# Patient Record
Sex: Female | Born: 1955 | Race: Black or African American | Hispanic: No | Marital: Single | Smoking: Never smoker
Health system: Southern US, Community
[De-identification: ages and names within clinical notes are randomized; demographics above are authoritative.]

## PROBLEM LIST (undated history)

## (undated) DIAGNOSIS — E785 Hyperlipidemia, unspecified: Secondary | ICD-10-CM

## (undated) DIAGNOSIS — G458 Other transient cerebral ischemic attacks and related syndromes: Secondary | ICD-10-CM

## (undated) DIAGNOSIS — I10 Essential (primary) hypertension: Secondary | ICD-10-CM

## (undated) DIAGNOSIS — F410 Panic disorder [episodic paroxysmal anxiety] without agoraphobia: Secondary | ICD-10-CM

## (undated) DIAGNOSIS — J45909 Unspecified asthma, uncomplicated: Secondary | ICD-10-CM

## (undated) DIAGNOSIS — K219 Gastro-esophageal reflux disease without esophagitis: Secondary | ICD-10-CM

## (undated) DIAGNOSIS — T7840XA Allergy, unspecified, initial encounter: Secondary | ICD-10-CM

## (undated) HISTORY — DX: Unspecified asthma, uncomplicated: J45.909

## (undated) HISTORY — DX: Allergy, unspecified, initial encounter: T78.40XA

## (undated) HISTORY — DX: Gastro-esophageal reflux disease without esophagitis: K21.9

## (undated) HISTORY — DX: Other transient cerebral ischemic attacks and related syndromes: G45.8

## (undated) HISTORY — PX: ABDOMINAL HYSTERECTOMY: SHX81

## (undated) HISTORY — DX: Hyperlipidemia, unspecified: E78.5

---

## 2009-08-04 ENCOUNTER — Ambulatory Visit (HOSPITAL_COMMUNITY): Admission: RE | Admit: 2009-08-04 | Discharge: 2009-08-04 | Payer: Self-pay | Admitting: Family Medicine

## 2010-08-26 ENCOUNTER — Other Ambulatory Visit (HOSPITAL_COMMUNITY): Payer: Self-pay | Admitting: Family Medicine

## 2010-08-30 ENCOUNTER — Other Ambulatory Visit (HOSPITAL_COMMUNITY): Payer: Self-pay | Admitting: Family Medicine

## 2010-08-30 DIAGNOSIS — Z1231 Encounter for screening mammogram for malignant neoplasm of breast: Secondary | ICD-10-CM

## 2010-09-09 ENCOUNTER — Ambulatory Visit (HOSPITAL_COMMUNITY)
Admission: RE | Admit: 2010-09-09 | Discharge: 2010-09-09 | Disposition: A | Discharge status: Home / Self Care | Payer: Self-pay | Source: Ambulatory Visit | Attending: Diagnostic Radiology | Admitting: Diagnostic Radiology

## 2010-09-09 DIAGNOSIS — Z1231 Encounter for screening mammogram for malignant neoplasm of breast: Secondary | ICD-10-CM

## 2011-08-19 ENCOUNTER — Other Ambulatory Visit (HOSPITAL_COMMUNITY): Payer: Self-pay | Admitting: Family Medicine

## 2014-02-21 ENCOUNTER — Emergency Department (HOSPITAL_COMMUNITY): Payer: Self-pay

## 2014-02-21 ENCOUNTER — Encounter (HOSPITAL_COMMUNITY): Payer: Self-pay | Admitting: *Deleted

## 2014-02-21 ENCOUNTER — Emergency Department (HOSPITAL_COMMUNITY)
Admission: EM | Admit: 2014-02-21 | Discharge: 2014-02-21 | Disposition: A | Discharge status: Home / Self Care | Payer: Self-pay | Attending: Emergency Medicine | Admitting: Emergency Medicine

## 2014-02-21 ENCOUNTER — Encounter (HOSPITAL_COMMUNITY): Payer: Self-pay | Admitting: Emergency Medicine

## 2014-02-21 DIAGNOSIS — R0602 Shortness of breath: Secondary | ICD-10-CM | POA: Insufficient documentation

## 2014-02-21 DIAGNOSIS — F410 Panic disorder [episodic paroxysmal anxiety] without agoraphobia: Secondary | ICD-10-CM | POA: Insufficient documentation

## 2014-02-21 DIAGNOSIS — N390 Urinary tract infection, site not specified: Secondary | ICD-10-CM | POA: Insufficient documentation

## 2014-02-21 DIAGNOSIS — R002 Palpitations: Secondary | ICD-10-CM | POA: Insufficient documentation

## 2014-02-21 DIAGNOSIS — I10 Essential (primary) hypertension: Secondary | ICD-10-CM | POA: Insufficient documentation

## 2014-02-21 DIAGNOSIS — Z79899 Other long term (current) drug therapy: Secondary | ICD-10-CM | POA: Insufficient documentation

## 2014-02-21 DIAGNOSIS — E876 Hypokalemia: Secondary | ICD-10-CM | POA: Insufficient documentation

## 2014-02-21 DIAGNOSIS — Z792 Long term (current) use of antibiotics: Secondary | ICD-10-CM | POA: Insufficient documentation

## 2014-02-21 DIAGNOSIS — R000 Tachycardia, unspecified: Secondary | ICD-10-CM | POA: Insufficient documentation

## 2014-02-21 DIAGNOSIS — R200 Anesthesia of skin: Secondary | ICD-10-CM | POA: Insufficient documentation

## 2014-02-21 DIAGNOSIS — R531 Weakness: Secondary | ICD-10-CM | POA: Insufficient documentation

## 2014-02-21 DIAGNOSIS — Z880 Allergy status to penicillin: Secondary | ICD-10-CM | POA: Insufficient documentation

## 2014-02-21 DIAGNOSIS — R51 Headache: Secondary | ICD-10-CM | POA: Insufficient documentation

## 2014-02-21 DIAGNOSIS — R55 Syncope and collapse: Secondary | ICD-10-CM | POA: Insufficient documentation

## 2014-02-21 HISTORY — DX: Essential (primary) hypertension: I10

## 2014-02-21 HISTORY — DX: Panic disorder (episodic paroxysmal anxiety): F41.0

## 2014-02-21 LAB — COMPREHENSIVE METABOLIC PANEL WITH GFR
ALT: 35 U/L (ref 0–35)
AST: 37 U/L (ref 0–37)
Albumin: 4.5 g/dL (ref 3.5–5.2)
Alkaline Phosphatase: 110 U/L (ref 39–117)
Anion gap: 7 (ref 5–15)
BUN: 17 mg/dL (ref 6–23)
CO2: 25 mmol/L (ref 19–32)
Calcium: 9.4 mg/dL (ref 8.4–10.5)
Chloride: 106 meq/L (ref 96–112)
Creatinine, Ser: 0.86 mg/dL (ref 0.50–1.10)
GFR calc Af Amer: 85 mL/min — ABNORMAL LOW
GFR calc non Af Amer: 74 mL/min — ABNORMAL LOW
Glucose, Bld: 234 mg/dL — ABNORMAL HIGH (ref 70–99)
Potassium: 2.8 mmol/L — ABNORMAL LOW (ref 3.5–5.1)
Sodium: 138 mmol/L (ref 135–145)
Total Bilirubin: 0.4 mg/dL (ref 0.3–1.2)
Total Protein: 8 g/dL (ref 6.0–8.3)

## 2014-02-21 LAB — LIPASE, BLOOD: Lipase: 53 U/L (ref 11–59)

## 2014-02-21 LAB — URINALYSIS, ROUTINE W REFLEX MICROSCOPIC
Bilirubin Urine: NEGATIVE
Glucose, UA: 250 mg/dL — AB
Ketones, ur: NEGATIVE mg/dL
Nitrite: NEGATIVE
Protein, ur: NEGATIVE mg/dL
Specific Gravity, Urine: 1.009 (ref 1.005–1.030)
Urobilinogen, UA: 0.2 mg/dL (ref 0.0–1.0)
pH: 5.5 (ref 5.0–8.0)

## 2014-02-21 LAB — CBC WITH DIFFERENTIAL/PLATELET
Basophils Absolute: 0 10*3/uL (ref 0.0–0.1)
Basophils Relative: 0 % (ref 0–1)
Eosinophils Absolute: 0.1 10*3/uL (ref 0.0–0.7)
Eosinophils Relative: 1 % (ref 0–5)
HCT: 39.9 % (ref 36.0–46.0)
Hemoglobin: 13.1 g/dL (ref 12.0–15.0)
Lymphocytes Relative: 19 % (ref 12–46)
Lymphs Abs: 1.9 10*3/uL (ref 0.7–4.0)
MCH: 30.2 pg (ref 26.0–34.0)
MCHC: 32.8 g/dL (ref 30.0–36.0)
MCV: 91.9 fL (ref 78.0–100.0)
Monocytes Absolute: 1 10*3/uL (ref 0.1–1.0)
Monocytes Relative: 9 % (ref 3–12)
Neutro Abs: 7.5 10*3/uL (ref 1.7–7.7)
Neutrophils Relative %: 71 % (ref 43–77)
Platelets: 277 10*3/uL (ref 150–400)
RBC: 4.34 MIL/uL (ref 3.87–5.11)
RDW: 12.5 % (ref 11.5–15.5)
WBC: 10.4 10*3/uL (ref 4.0–10.5)

## 2014-02-21 LAB — I-STAT TROPONIN, ED: Troponin i, poc: 0 ng/mL (ref 0.00–0.08)

## 2014-02-21 LAB — URINE MICROSCOPIC-ADD ON

## 2014-02-21 LAB — D-DIMER, QUANTITATIVE: D-Dimer, Quant: 0.32 ug{FEU}/mL (ref 0.00–0.48)

## 2014-02-21 MED ORDER — CIPROFLOXACIN HCL 500 MG PO TABS: 500.0000 mg | ORAL_TABLET | Freq: Two times a day (BID) | ORAL | Status: DC

## 2014-02-21 MED ORDER — ALPRAZOLAM 0.25 MG PO TABS: 0.2500 mg | ORAL_TABLET | Freq: Three times a day (TID) | ORAL | Status: DC | PRN

## 2014-02-21 MED ORDER — SODIUM CHLORIDE 0.9 % IV SOLN
INTRAVENOUS | Status: DC
  Administered 2014-02-21: 01:00:00 via INTRAVENOUS

## 2014-02-21 MED ORDER — ALPRAZOLAM 0.25 MG PO TABS
0.2500 mg | ORAL_TABLET | Freq: Once | ORAL | Status: AC
  Administered 2014-02-21: 0.25 mg via ORAL
  Filled 2014-02-21: qty 1

## 2014-02-21 MED ORDER — CIPROFLOXACIN HCL 500 MG PO TABS
500.0000 mg | ORAL_TABLET | Freq: Once | ORAL | Status: AC
  Administered 2014-02-21: 500 mg via ORAL
  Filled 2014-02-21: qty 1

## 2014-02-21 MED ORDER — POTASSIUM CHLORIDE ER 10 MEQ PO TBCR: 10.0000 meq | EXTENDED_RELEASE_TABLET | Freq: Two times a day (BID) | ORAL | Status: DC

## 2014-02-21 MED ORDER — POTASSIUM CHLORIDE CRYS ER 20 MEQ PO TBCR
40.0000 meq | EXTENDED_RELEASE_TABLET | Freq: Once | ORAL | Status: AC
  Administered 2014-02-21: 40 meq via ORAL
  Filled 2014-02-21: qty 2

## 2014-02-21 NOTE — ED Notes (Signed)
Patient transported to CT 

## 2014-02-21 NOTE — Discharge Instructions (Signed)

## 2014-02-21 NOTE — ED Notes (Signed)
Returned from xray

## 2014-02-21 NOTE — Discharge Instructions (Signed)
Workup for symptoms without any significant findings. Only exception would be possible urinary tract infection. Urine culture was sent and is pending and will confirm. Take the Cipro as directed. Follow-up with your doctor in the next few days. Return for any new or worse symptoms. In addition lab findings showed some mild the low potassium level. Take the potassium as directed.

## 2014-02-21 NOTE — ED Provider Notes (Signed)
CSN: [id number]     Arrival date & time 02/21/14  2120 History  This chart was scribed for Elpidio Anis, PA-C with Suzi Roots, MD by Tonye Royalty, ED Scribe. This patient was seen in room WTR6/WTR6 and the patient's care was started at 9:55 PM.    Chief Complaint  Patient presents with  . Panic Attack   The history is provided by the patient. No language interpreter was used.    HPI Comments: Joyce Lynn is a 59 y.o. female who presents to the Emergency Department complaining of panic attacks. She was evaluated this morning at 0300 for the same, but states it is worse now. She reports history of panic attacks for which she normally takes Xanax. She states her Xanax is almost out and has been "pinching off" and using small amounts. She states she has been having chest pressure, palpitations, chills in her back, tingling in her legs and hands, SOB, and posterior headache. She reports large amount of recent weight gain. She had a thorough workup this morning. She states she has not had thyroid testing in some time.  Past Medical History  Diagnosis Date  . Panic attacks   . Hypertension    Past Surgical History  Procedure Laterality Date  . Abdominal hysterectomy     History reviewed. No pertinent family history. History  Substance Use Topics  . Smoking status: Never Smoker   . Smokeless tobacco: Not on file  . Alcohol Use: Yes     Comment: occ   OB History    No data available     Review of Systems  Constitutional: Positive for chills and unexpected weight change.  Respiratory: Positive for shortness of breath.   Cardiovascular: Positive for chest pain and palpitations.  Neurological: Positive for headaches.       Tingling  Psychiatric/Behavioral: The patient is nervous/anxious.      Allergies  Penicillins  Home Medications   Prior to Admission medications   Medication Sig Start Date End Date Taking? Authorizing Provider  ALPRAZolam Prudy Feeler) 0.25 MG tablet Take  0.25 mg by mouth daily as needed for anxiety.   Yes Historical Provider, MD  ciprofloxacin (CIPRO) 500 MG tablet Take 1 tablet (500 mg total) by mouth 2 (two) times daily. 02/21/14  Yes Vanetta Mulders, MD  escitalopram (LEXAPRO) 10 MG tablet Take 10 mg by mouth daily.   Yes Historical Provider, MD  lisinopril-hydrochlorothiazide (PRINZIDE,ZESTORETIC) 20-25 MG per tablet Take 1 tablet by mouth daily.   Yes Historical Provider, MD  Melatonin 10 MG TABS Take 1 tablet by mouth at bedtime as needed (sleep).   Yes Historical Provider, MD  potassium chloride (K-DUR) 10 MEQ tablet Take 1 tablet (10 mEq total) by mouth 2 (two) times daily. 02/21/14  Yes Vanetta Mulders, MD   BP 134/81 mmHg  Pulse 130  Temp(Src) 98.1 F (36.7 C) (Oral)  Resp 16  SpO2 100% Physical Exam  Constitutional: She is oriented to person, place, and time. She appears well-developed and well-nourished.  HENT:  Head: Normocephalic and atraumatic.  Eyes: Conjunctivae are normal.  Neck: Normal range of motion. Neck supple.  Cardiovascular: Regular rhythm and normal heart sounds.   No murmur heard. tachycardic  Pulmonary/Chest: Effort normal and breath sounds normal. No respiratory distress. She has no wheezes. She has no rales.  Musculoskeletal: Normal range of motion.  Neurological: She is alert and oriented to person, place, and time.  Skin: Skin is warm and dry.  Psychiatric: Her mood appears  anxious.  Nursing note and vitals reviewed.   ED Course  Procedures (including critical care time)  DIAGNOSTIC STUDIES: Oxygen Saturation is 100% on room air, normal by my interpretation.    COORDINATION OF CARE: 9:59 PM Discussed treatment plan with patient at beside, including prescription for Xanax with follow up to her PCP. Discussed that since she had a very thorough workup this morning and she states this is like previous panic attacks, I do not plan to order any more. The patient agrees with the plan and has no further  questions at this time.   Labs Review Labs Reviewed - No data to display  Imaging Review Dg Chest 2 View  02/21/2014   CLINICAL DATA:  Shortness of breath, near syncope.  EXAM: CHEST  2 VIEW  COMPARISON:  None.  FINDINGS: Cardiomediastinal silhouette is unremarkable. The lungs are clear without pleural effusions or focal consolidations. Trachea projects midline and there is no pneumothorax. Soft tissue planes and included osseous structures are non-suspicious.  IMPRESSION: Normal chest.   Electronically Signed   By: Awilda Metro   On: 02/21/2014 02:28   Ct Head Wo Contrast  02/21/2014   CLINICAL DATA:  Near syncope and shortness of breath  EXAM: CT HEAD WITHOUT CONTRAST  TECHNIQUE: Contiguous axial images were obtained from the base of the skull through the vertex without intravenous contrast.  COMPARISON:  None.  FINDINGS: Skull and Sinuses:Negative for fracture or destructive process. The mastoids, middle ears, and imaged paranasal sinuses are clear.  Orbits: Bilateral cataract resection.  Brain: No evidence of acute infarction, hemorrhage, hydrocephalus, or mass lesion/mass effect.  IMPRESSION: Negative head CT.   Electronically Signed   By: Tiburcio Pea M.D.   On: 02/21/2014 02:09     EKG Interpretation None      MDM   Final diagnoses:  None    1. Panic and anxiety  The patient returns to the emergency room tonight after being evaluated for similar symptoms this morning - palpitations, anxiety, SOB). She states symptoms are like her panic attacks and she is out of her Xanax. Chart reviewed. Full work up this morning, including Head CT, labs (incl. Neg D-Dimer). Do not feel any further evaluation is required. She will see her doctor this week for further management of panic and anxiety.  I personally performed the services described in this documentation, which was scribed in my presence. The recorded information has been reviewed and is accurate.     Arnoldo Hooker,  PA-C 02/21/14 2222  Suzi Roots, MD 02/22/14 [phone]

## 2014-02-21 NOTE — ED Provider Notes (Signed)
CSN: [id number]     Arrival date & time 02/21/14  0036 History   First MD Initiated Contact with Patient 02/21/14 0048     Chief Complaint  Patient presents with  . Palpitations  . Numbness     (Consider location/radiation/quality/duration/timing/severity/associated sxs/prior Treatment) Patient is a 59 y.o. female presenting with palpitations. The history is provided by the patient.  Palpitations Associated symptoms: dizziness and shortness of breath   Associated symptoms: no chest pain, no nausea and no vomiting    patient presents with a complaint of numbness to the back of her head and dizziness feeling without vertigo for a couple weeks. A light feeling in the chest no real chest pain. Some shortness of breath. Bilateral numbness to the legs that began today. Patient also states that she been having urinary frequency. But no pain with urination. No loss of consciousness no syncope no chest pain. No fevers.  Past Medical History  Diagnosis Date  . Panic attacks   . Hypertension    Past Surgical History  Procedure Laterality Date  . Abdominal hysterectomy     No family history on file. History  Substance Use Topics  . Smoking status: Never Smoker   . Smokeless tobacco: Not on file  . Alcohol Use: Yes     Comment: occ   OB History    No data available     Review of Systems  Constitutional: Negative for fever.  HENT: Negative for congestion.   Eyes: Negative for visual disturbance.  Respiratory: Positive for shortness of breath.   Cardiovascular: Positive for palpitations. Negative for chest pain.  Gastrointestinal: Negative for nausea, vomiting and abdominal pain.  Genitourinary: Positive for frequency. Negative for dysuria.  Musculoskeletal: Negative for myalgias.  Skin: Negative for rash.  Neurological: Positive for dizziness and weakness.  Hematological: Does not bruise/bleed easily.  Psychiatric/Behavioral: Negative for confusion.      Allergies   Penicillins  Home Medications   Prior to Admission medications   Medication Sig Start Date End Date Taking? Authorizing Provider  ALPRAZolam Prudy Feeler) 0.25 MG tablet Take 0.25 mg by mouth daily as needed for anxiety.   Yes Historical Provider, MD  escitalopram (LEXAPRO) 10 MG tablet Take 10 mg by mouth daily.   Yes Historical Provider, MD  lisinopril-hydrochlorothiazide (PRINZIDE,ZESTORETIC) 20-25 MG per tablet Take 1 tablet by mouth daily.   Yes Historical Provider, MD  Melatonin 10 MG TABS Take 1 tablet by mouth at bedtime as needed (sleep).   Yes Historical Provider, MD  ciprofloxacin (CIPRO) 500 MG tablet Take 1 tablet (500 mg total) by mouth 2 (two) times daily. 02/21/14   Vanetta Mulders, MD  potassium chloride (K-DUR) 10 MEQ tablet Take 1 tablet (10 mEq total) by mouth 2 (two) times daily. 02/21/14   Vanetta Mulders, MD   BP 129/87 mmHg  Pulse 109  Temp(Src) 98.2 F (36.8 C) (Oral)  Resp 17  Ht  (1.626 m)  Wt 164 lb (74.39 kg)  BMI 28.14 kg/m2  SpO2 100% Physical Exam  Constitutional: She is oriented to person, place, and time. She appears well-developed and well-nourished. No distress.  HENT:  Head: Normocephalic and atraumatic.  Mouth/Throat: Oropharynx is clear and moist.  Eyes: Conjunctivae and EOM are normal. Pupils are equal, round, and reactive to light.  Neck: Normal range of motion.  Cardiovascular: Normal rate, regular rhythm and normal heart sounds.   Pulmonary/Chest: Effort normal and breath sounds normal. No respiratory distress.  Abdominal: Soft. Bowel sounds are normal. There  is no tenderness.  Musculoskeletal: Normal range of motion. She exhibits no edema.  Neurological: She is alert and oriented to person, place, and time. No cranial nerve deficit. She exhibits normal muscle tone. Coordination normal.  Skin: Skin is warm. No rash noted.  Nursing note and vitals reviewed.   ED Course  Procedures (including critical care time) Labs Review Labs Reviewed   URINALYSIS, ROUTINE W REFLEX MICROSCOPIC - Abnormal; Notable for the following:    Glucose, UA 250 (*)    Hgb urine dipstick TRACE (*)    Leukocytes, UA SMALL (*)    All other components within normal limits  COMPREHENSIVE METABOLIC PANEL - Abnormal; Notable for the following:    Potassium 2.8 (*)    Glucose, Bld 234 (*)    GFR calc non Af Amer 74 (*)    GFR calc Af Amer 85 (*)    All other components within normal limits  URINE CULTURE  LIPASE, BLOOD  CBC WITH DIFFERENTIAL  D-DIMER, QUANTITATIVE  URINE MICROSCOPIC-ADD ON  I-STAT TROPOININ, ED   Results for orders placed or performed during the hospital encounter of 02/21/14  Lipase, blood  Result Value Ref Range   Lipase 53 11 - 59 U/L  Urinalysis, Routine w reflex microscopic  Result Value Ref Range   Color, Urine YELLOW YELLOW   APPearance CLEAR CLEAR   Specific Gravity, Urine 1.009 1.005 - 1.030   pH 5.5 5.0 - 8.0   Glucose, UA 250 (A) NEGATIVE mg/dL   Hgb urine dipstick TRACE (A) NEGATIVE   Bilirubin Urine NEGATIVE NEGATIVE   Ketones, ur NEGATIVE NEGATIVE mg/dL   Protein, ur NEGATIVE NEGATIVE mg/dL   Urobilinogen, UA 0.2 0.0 - 1.0 mg/dL   Nitrite NEGATIVE NEGATIVE   Leukocytes, UA SMALL (A) NEGATIVE  CBC with Differential  Result Value Ref Range   WBC 10.4 4.0 - 10.5 K/uL   RBC 4.34 3.87 - 5.11 MIL/uL   Hemoglobin 13.1 12.0 - 15.0 g/dL   HCT 69.6 29.5 - 28.4 %   MCV 91.9 78.0 - 100.0 fL   MCH 30.2 26.0 - 34.0 pg   MCHC 32.8 30.0 - 36.0 g/dL   RDW 13.2 44.0 - 10.2 %   Platelets 277 150 - 400 K/uL   Neutrophils Relative % 71 43 - 77 %   Neutro Abs 7.5 1.7 - 7.7 K/uL   Lymphocytes Relative 19 12 - 46 %   Lymphs Abs 1.9 0.7 - 4.0 K/uL   Monocytes Relative 9 3 - 12 %   Monocytes Absolute 1.0 0.1 - 1.0 K/uL   Eosinophils Relative 1 0 - 5 %   Eosinophils Absolute 0.1 0.0 - 0.7 K/uL   Basophils Relative 0 0 - 1 %   Basophils Absolute 0.0 0.0 - 0.1 K/uL  Comprehensive metabolic panel  Result Value Ref Range    Sodium 138 135 - 145 mmol/L   Potassium 2.8 (L) 3.5 - 5.1 mmol/L   Chloride 106 96 - 112 mEq/L   CO2 25 19 - 32 mmol/L   Glucose, Bld 234 (H) 70 - 99 mg/dL   BUN 17 6 - 23 mg/dL   Creatinine, Ser 7.25 0.50 - 1.10 mg/dL   Calcium 9.4 8.4 - 36.6 mg/dL   Total Protein 8.0 6.0 - 8.3 g/dL   Albumin 4.5 3.5 - 5.2 g/dL   AST 37 0 - 37 U/L   ALT 35 0 - 35 U/L   Alkaline Phosphatase 110 39 - 117 U/L   Total  Bilirubin 0.4 0.3 - 1.2 mg/dL   GFR calc non Af Amer 74 (L) >90 mL/min   GFR calc Af Amer 85 (L) >90 mL/min   Anion gap 7 5 - 15  D-dimer, quantitative  Result Value Ref Range   D-Dimer, Quant 0.32 0.00 - 0.48 ug/mL-FEU  Urine microscopic-add on  Result Value Ref Range   Squamous Epithelial / LPF RARE RARE   WBC, UA 7-10 <3 WBC/hpf   RBC / HPF 0-2 <3 RBC/hpf   Bacteria, UA RARE RARE  I-Stat Troponin, ED (not at [facility])  Result Value Ref Range   Troponin i, poc 0.00 0.00 - 0.08 ng/mL   Comment 3             Imaging Review Dg Chest 2 View  02/21/2014   CLINICAL DATA:  Shortness of breath, near syncope.  EXAM: CHEST  2 VIEW  COMPARISON:  None.  FINDINGS: Cardiomediastinal silhouette is unremarkable. The lungs are clear without pleural effusions or focal consolidations. Trachea projects midline and there is no pneumothorax. Soft tissue planes and included osseous structures are non-suspicious.  IMPRESSION: Normal chest.   Electronically Signed   By: Awilda Metro   On: 02/21/2014 02:28   Ct Head Wo Contrast  02/21/2014   CLINICAL DATA:  Near syncope and shortness of breath  EXAM: CT HEAD WITHOUT CONTRAST  TECHNIQUE: Contiguous axial images were obtained from the base of the skull through the vertex without intravenous contrast.  COMPARISON:  None.  FINDINGS: Skull and Sinuses:Negative for fracture or destructive process. The mastoids, middle ears, and imaged paranasal sinuses are clear.  Orbits: Bilateral cataract resection.  Brain: No evidence of acute infarction, hemorrhage,  hydrocephalus, or mass lesion/mass effect.  IMPRESSION: Negative head CT.   Electronically Signed   By: Tiburcio Pea M.D.   On: 02/21/2014 02:09     EKG Interpretation   Date/Time:  Friday February 21 2014 01:16:00 EST Ventricular Rate:  109 PR Interval:  164 QRS Duration: 86 QT Interval:  352 QTC Calculation: 474 R Axis:   76 Text Interpretation:  Sinus tachycardia Anterior infarct , age  undetermined Abnormal ECG No previous ECGs available Confirmed by  Danella Philson  MD, Danyle Boening [phone]) on 02/21/2014 1:24:34 AM      MDM   Final diagnoses:  SOB (shortness of breath)  Near syncope  UTI (lower urinary tract infection)  Hypokalemia    The patient with complaint of several symptoms. One was the complaint of palpitations shortness of breath feeling of near syncope. And numbness to both legs.  Workup to include head CT chest x-ray were negative. No evidence of pneumonia. D-dimer was negative making pulmonary embolus very unlikely. EKG without acute cardiac changes other than some mild tachycardia. Troponin was negative. Electrolytes normal except for potassium which was 2.8. Patient given some by mouth potassium here and will continue past potassium at home for a couple days.  Urinalysis suggestive of urinary tract infection culture sent for confirmation. Patient will be started on Cipro.  Vanetta Mulders, MD 02/21/14 [phone]

## 2014-02-21 NOTE — ED Notes (Signed)
Pt has a ride home.  

## 2014-02-21 NOTE — ED Notes (Signed)
Pt reports that she has had increased anxiety since taking care of her family members. Pt states that "It becomes to much and I feel like I can leave this world." Pt denies SI/HI stating she feels like she is having a panic attack, hx of same. Pt has ran out of her Xanax and states "I can't afford a doctor. I have just been taking a pinch off of the last two pills I had." Pt tearful, reports palpitations and shortness of breath with increased anxiety.

## 2014-02-21 NOTE — ED Notes (Signed)
Pt states that she has had numbness to the back of her head and dizziness for a couple of weeks; pt states that today she feels "light" in her chest and having some palpitations; pt c/o numbness to legs that began today; pt states that her mouth is dry and is having lower back pain

## 2014-02-23 LAB — URINE CULTURE: Colony Count: 6000

## 2014-09-17 ENCOUNTER — Emergency Department (HOSPITAL_COMMUNITY): Payer: Self-pay

## 2014-09-17 ENCOUNTER — Emergency Department (HOSPITAL_COMMUNITY)
Admission: EM | Admit: 2014-09-17 | Discharge: 2014-09-17 | Disposition: A | Discharge status: Home / Self Care | Payer: Self-pay | Attending: Emergency Medicine | Admitting: Emergency Medicine

## 2014-09-17 ENCOUNTER — Encounter (HOSPITAL_COMMUNITY): Payer: Self-pay | Admitting: Emergency Medicine

## 2014-09-17 DIAGNOSIS — R0789 Other chest pain: Secondary | ICD-10-CM

## 2014-09-17 DIAGNOSIS — Z79899 Other long term (current) drug therapy: Secondary | ICD-10-CM | POA: Insufficient documentation

## 2014-09-17 DIAGNOSIS — R200 Anesthesia of skin: Secondary | ICD-10-CM | POA: Insufficient documentation

## 2014-09-17 DIAGNOSIS — Z880 Allergy status to penicillin: Secondary | ICD-10-CM | POA: Insufficient documentation

## 2014-09-17 DIAGNOSIS — R51 Headache: Secondary | ICD-10-CM | POA: Insufficient documentation

## 2014-09-17 DIAGNOSIS — Z792 Long term (current) use of antibiotics: Secondary | ICD-10-CM | POA: Insufficient documentation

## 2014-09-17 DIAGNOSIS — F419 Anxiety disorder, unspecified: Secondary | ICD-10-CM

## 2014-09-17 DIAGNOSIS — I10 Essential (primary) hypertension: Secondary | ICD-10-CM | POA: Insufficient documentation

## 2014-09-17 DIAGNOSIS — F410 Panic disorder [episodic paroxysmal anxiety] without agoraphobia: Secondary | ICD-10-CM | POA: Insufficient documentation

## 2014-09-17 DIAGNOSIS — R0602 Shortness of breath: Secondary | ICD-10-CM | POA: Insufficient documentation

## 2014-09-17 DIAGNOSIS — K219 Gastro-esophageal reflux disease without esophagitis: Secondary | ICD-10-CM

## 2014-09-17 DIAGNOSIS — R251 Tremor, unspecified: Secondary | ICD-10-CM | POA: Insufficient documentation

## 2014-09-17 LAB — RAPID URINE DRUG SCREEN, HOSP PERFORMED
Amphetamines: NOT DETECTED
Barbiturates: NOT DETECTED
Benzodiazepines: NOT DETECTED
COCAINE: NOT DETECTED
Opiates: NOT DETECTED
Tetrahydrocannabinol: NOT DETECTED

## 2014-09-17 LAB — CBC
HCT: 38.2 % (ref 36.0–46.0)
Hemoglobin: 13 g/dL (ref 12.0–15.0)
MCH: 30.5 pg (ref 26.0–34.0)
MCHC: 34 g/dL (ref 30.0–36.0)
MCV: 89.7 fL (ref 78.0–100.0)
PLATELETS: 265 10*3/uL (ref 150–400)
RBC: 4.26 MIL/uL (ref 3.87–5.11)
RDW: 12.6 % (ref 11.5–15.5)
WBC: 8.7 10*3/uL (ref 4.0–10.5)

## 2014-09-17 LAB — BASIC METABOLIC PANEL
Anion gap: 10 (ref 5–15)
BUN: 13 mg/dL (ref 6–20)
CO2: 27 mmol/L (ref 22–32)
Calcium: 9.3 mg/dL (ref 8.9–10.3)
Chloride: 104 mmol/L (ref 101–111)
Creatinine, Ser: 0.99 mg/dL (ref 0.44–1.00)
GFR calc Af Amer: >60 mL/min (ref >60)
GLUCOSE: 100 mg/dL — AB (ref 65–99)
Potassium: 3.3 mmol/L — ABNORMAL LOW (ref 3.5–5.1)
Sodium: 141 mmol/L (ref 135–145)

## 2014-09-17 LAB — URINALYSIS, ROUTINE W REFLEX MICROSCOPIC
Bilirubin Urine: NEGATIVE
Glucose, UA: NEGATIVE mg/dL
Hgb urine dipstick: NEGATIVE
Ketones, ur: NEGATIVE mg/dL
Leukocytes, UA: NEGATIVE
Nitrite: NEGATIVE
PH: 7 (ref 5.0–8.0)
PROTEIN: NEGATIVE mg/dL
Specific Gravity, Urine: 1.011 (ref 1.005–1.030)
UROBILINOGEN UA: 0.2 mg/dL (ref 0.0–1.0)

## 2014-09-17 LAB — I-STAT TROPONIN, ED: Troponin i, poc: 0 ng/mL (ref 0.00–0.08)

## 2014-09-17 MED ORDER — ALUM & MAG HYDROXIDE-SIMETH 200-200-20 MG/5ML PO SUSP: 15.0000 mL | Freq: Four times a day (QID) | ORAL | Status: DC | PRN

## 2014-09-17 MED ORDER — ONDANSETRON 8 MG PO TBDP
8.0000 mg | ORAL_TABLET | Freq: Once | ORAL | Status: AC
  Administered 2014-09-17: 8 mg via ORAL
  Filled 2014-09-17: qty 1

## 2014-09-17 MED ORDER — HYDROXYZINE HCL 25 MG PO TABS
25.0000 mg | ORAL_TABLET | Freq: Once | ORAL | Status: AC
  Administered 2014-09-17: 25 mg via ORAL
  Filled 2014-09-17: qty 1

## 2014-09-17 MED ORDER — GI COCKTAIL ~~LOC~~
30.0000 mL | Freq: Once | ORAL | Status: AC
  Administered 2014-09-17: 30 mL via ORAL
  Filled 2014-09-17: qty 30

## 2014-09-17 NOTE — Discharge Instructions (Signed)
Please follow-up with your doctor in psychologist as arranged.  Continue taking hydroxyzine for anxiety.   Chest Pain Observation It is often hard to give a specific diagnosis for the cause of chest pain. Among other possibilities your symptoms might be caused by inadequate oxygen delivery to your heart (angina). Angina that is not treated or evaluated can lead to a heart attack (myocardial infarction) or death. Blood tests, electrocardiograms, and X-rays may have been done to help determine a possible cause of your chest pain. After evaluation and observation, your health care provider has determined that it is unlikely your pain was caused by an unstable condition that requires hospitalization. However, a full evaluation of your pain may need to be completed, with additional diagnostic testing as directed. It is very important to keep your follow-up appointments. Not keeping your follow-up appointments could result in permanent heart damage, disability, or death. If there is any problem keeping your follow-up appointments, you must call your health care provider. HOME CARE INSTRUCTIONS  Due to the slight chance that your pain could be angina, it is important to follow your health care provider's treatment plan and also maintain a healthy lifestyle:  Maintain or work toward achieving a healthy weight.  Stay physically active and exercise regularly.  Decrease your salt intake.  Eat a balanced, healthy diet. Talk to a dietitian to learn about heart-healthy foods.  Increase your fiber intake by including whole grains, vegetables, fruits, and nuts in your diet.  Avoid situations that cause stress, anger, or depression.  Take medicines as advised by your health care provider. Report any side effects to your health care provider. Do not stop medicines or adjust the dosages on your own.  Quit smoking. Do not use nicotine patches or gum until you check with your health care provider.  Keep your  blood pressure, blood sugar, and cholesterol levels within normal limits.  Limit alcohol intake to no more than 1 drink per day for women who are not pregnant and 2 drinks per day for men.  Do not abuse drugs. SEEK IMMEDIATE MEDICAL CARE IF: You have severe chest pain or pressure which may include symptoms such as:  You feel pain or pressure in your arms, neck, jaw, or back.  You have severe back or abdominal pain, feel sick to your stomach (nauseous), or throw up (vomit).  You are sweating profusely.  You are having a fast or irregular heartbeat.  You feel short of breath while at rest.  You notice increasing shortness of breath during rest, sleep, or with activity.  You have chest pain that does not get better after rest or after taking your usual medicine.  You wake from sleep with chest pain.  You are unable to sleep because you cannot breathe.  You develop a frequent cough or you are coughing up blood.  You feel dizzy, faint, or experience extreme fatigue.  You develop severe weakness, dizziness, fainting, or chills. Any of these symptoms may represent a serious problem that is an emergency. Do not wait to see if the symptoms will go away. Call your local emergency services (911 in the [place] Do not drive yourself to the hospital. MAKE SURE YOU:  Understand these instructions.  Will watch your condition.  Will get help right away if you are not doing well or get worse. Document Released: 03/12/2010 Document Revised: 02/12/2013 Document Reviewed: 08/09/2012 [facility] Patient Information 2015 [place], [place]. This information is not intended to replace advice given to you by your  health care provider. Make sure you discuss any questions you have with your health care provider.  Food Choices for Gastroesophageal Reflux Disease When you have gastroesophageal reflux disease (GERD), the foods you eat and your eating habits are very important. Choosing the right foods can help  ease your discomfort.  WHAT GUIDELINES DO I NEED TO FOLLOW?   Choose fruits, vegetables, whole grains, and low-fat dairy products.   Choose low-fat meat, fish, and poultry.  Limit fats such as oils, salad dressings, butter, nuts, and avocado.   Keep a food diary. This helps you identify foods that cause symptoms.   Avoid foods that cause symptoms. These may be different for everyone.   Eat small meals often instead of 3 large meals a day.   Eat your meals slowly, in a place where you are relaxed.   Limit fried foods.   Cook foods using methods other than frying.   Avoid drinking alcohol.   Avoid drinking large amounts of liquids with your meals.   Avoid bending over or lying down until 2-3 hours after eating.  WHAT FOODS ARE NOT RECOMMENDED?  These are some foods and drinks that may make your symptoms worse: Vegetables Tomatoes. Tomato juice. Tomato and spaghetti sauce. Chili peppers. Onion and garlic. Horseradish. Fruits Oranges, grapefruit, and lemon (fruit and juice). Meats High-fat meats, fish, and poultry. This includes hot dogs, ribs, ham, sausage, salami, and bacon. Dairy Whole milk and chocolate milk. Sour cream. Cream. Butter. Ice cream. Cream cheese.  Drinks Coffee and tea. Bubbly (carbonated) drinks or energy drinks. Condiments Hot sauce. Barbecue sauce.  Sweets/Desserts Chocolate and cocoa. Donuts. Peppermint and spearmint. Fats and Oils High-fat foods. This includes [place] fries and potato chips. Other Vinegar. Strong spices. This includes black pepper, white pepper, red pepper, cayenne, curry powder, cloves, ginger, and chili powder. The items listed above may not be a complete list of foods and drinks to avoid. Contact your dietitian for more information. Document Released: 08/09/2011 Document Revised: 02/12/2013 Document Reviewed: 12/12/2012 [facility] Patient Information 2015 [place], [place]. This information is not intended to replace advice  given to you by your health care provider. Make sure you discuss any questions you have with your health care provider.  Gastroesophageal Reflux Disease, Adult Gastroesophageal reflux disease (GERD) happens when acid from your stomach goes into your food pipe (esophagus). The acid can cause a burning feeling in your chest. Over time, the acid can make small holes (ulcers) in your food pipe.  HOME CARE  Ask your doctor for advice about:  Losing weight.  Quitting smoking.  Alcohol use.  Avoid foods and drinks that make your problems worse. You may want to avoid:  Caffeine and alcohol.  Chocolate.  Mints.  Garlic and onions.  Spicy foods.  Citrus fruits, such as oranges, lemons, or limes.  Foods that contain tomato, such as sauce, chili, salsa, and pizza.  Fried and fatty foods.  Avoid lying down for 3 hours before you go to bed or before you take a nap.  Eat small meals often, instead of large meals.  Wear loose-fitting clothing. Do not wear anything tight around your waist.  Raise (elevate) the head of your bed 6 to 8 inches with wood blocks. Using extra pillows does not help.  Only take medicines as told by your doctor.  Do not take aspirin or ibuprofen. GET HELP RIGHT AWAY IF:   You have pain in your arms, neck, jaw, teeth, or back.  Your pain gets worse or  changes.  You feel sick to your stomach (nauseous), throw up (vomit), or sweat (diaphoresis).  You feel short of breath, or you pass out (faint).  Your throw up is green, yellow, black, or looks like coffee grounds or blood.  Your poop (stool) is red, bloody, or black. MAKE SURE YOU:   Understand these instructions.  Will watch your condition.  Will get help right away if you are not doing well or get worse. Document Released: 07/27/2007 Document Revised: 05/02/2011 Document Reviewed: 08/27/2010 [facility] Patient Information 2015 [place], [place]. This information is not intended to replace advice  given to you by your health care provider. Make sure you discuss any questions you have with your health care provider.  Panic Attacks Panic attacks are sudden, short feelings of great fear or discomfort. You may have them for no reason when you are relaxed, when you are uneasy (anxious), or when you are sleeping.  HOME CARE  Take all your medicines as told.  Check with your doctor before starting new medicines.  Keep all doctor visits. GET HELP IF:  You are not able to take your medicines as told.  Your symptoms do not get better.  Your symptoms get worse. GET HELP RIGHT AWAY IF:  Your attacks seem different than your normal attacks.  You have thoughts about hurting yourself or others.  You take panic attack medicine and you have a side effect. MAKE SURE YOU:  Understand these instructions.  Will watch your condition.  Will get help right away if you are not doing well or get worse. Document Released: 03/12/2010 Document Revised: 11/28/2012 Document Reviewed: 09/21/2012 [facility] Patient Information 2015 [place], [place]. This information is not intended to replace advice given to you by your health care provider. Make sure you discuss any questions you have with your health care provider.

## 2014-09-17 NOTE — ED Provider Notes (Signed)
CSN: [id number]     Arrival date & time 09/17/14  0224 History  This chart was scribed for Marisa Severin, MD by Doreatha Martin, ED Scribe. This patient was seen in room WA17/WA17 and the patient's care was started at 3:38 AM.      Chief Complaint  Patient presents with  . Chest Pain   The history is provided by the patient. No language interpreter was used.    HPI Comments: Joyce Lynn is a 59 y.o. female with Hx of panic attacks who presents to the Emergency Department complaining of moderate left sided chest pain onset 3 weeks ago. She reports associated difficulty sleeping, difficulty swallowing, SOB, HA, teeth chattering, tremor of the legs, and numbness in bilateral arms. Pt believes that CP is related to acid reflux. Pt states she was seen in the ED in [facility] 1 week ago and was Dx with GERD and given an Rx of Prilosec and Atarax. Pt notes that she believes the Prilosec made her sick as she began to vomit after 3 doses. She states that she takes Hydroxyzine (last dose at 2000) for her anxiety. Pt states she also took a dose of Nexium at 2000 with mild relief. She notes that she was prescribed 20 0.25mg  Xanax 3 weeks ago and ran out 5 days ago. [facility]. She states that she slept for about 30 minutes last night. She denies other symptoms.   Past Medical History  Diagnosis Date  . Panic attacks   . Hypertension    Past Surgical History  Procedure Laterality Date  . Abdominal hysterectomy     History reviewed. No pertinent family history. History  Substance Use Topics  . Smoking status: Never Smoker   . Smokeless tobacco: Not on file  . Alcohol Use: Yes     Comment: occ   OB History    No data available     Review of Systems  HENT: Positive for trouble swallowing.   Respiratory: Positive for shortness of breath.   Cardiovascular: Positive for chest pain.  Neurological: Positive for tremors, numbness and headaches.   Psychiatric/Behavioral: Positive for sleep disturbance.  All other systems reviewed and are negative.  Allergies  Penicillins  Home Medications   Prior to Admission medications   Medication Sig Start Date End Date Taking? Authorizing Provider  ALPRAZolam (XANAX) 0.25 MG tablet Take 1 tablet (0.25 mg total) by mouth 3 (three) times daily as needed for anxiety. 02/21/14   Elpidio Anis, PA-C  ciprofloxacin (CIPRO) 500 MG tablet Take 1 tablet (500 mg total) by mouth 2 (two) times daily. 02/21/14   Vanetta Mulders, MD  escitalopram (LEXAPRO) 10 MG tablet Take 10 mg by mouth daily.    Historical Provider, MD  lisinopril-hydrochlorothiazide (PRINZIDE,ZESTORETIC) 20-25 MG per tablet Take 1 tablet by mouth daily.    Historical Provider, MD  Melatonin 10 MG TABS Take 1 tablet by mouth at bedtime as needed (sleep).    Historical Provider, MD  potassium chloride (K-DUR) 10 MEQ tablet Take 1 tablet (10 mEq total) by mouth 2 (two) times daily. 02/21/14   Vanetta Mulders, MD   BP 139/99 mmHg  Pulse 105  Temp(Src) 97.8 F (36.6 C) (Oral)  Resp 25 Physical Exam  Constitutional: She is oriented to person, place, and time. She appears well-developed and well-nourished. She appears distressed.  HENT:  Head: Normocephalic and atraumatic.  Nose: Nose normal.  Mouth/Throat: Oropharynx is  clear and moist.  Eyes: Conjunctivae and EOM are normal. Pupils are equal, round, and reactive to light.  Neck: Normal range of motion. Neck supple. No JVD present. No tracheal deviation present. No thyromegaly present.  Cardiovascular: Normal rate, regular rhythm, normal heart sounds and intact distal pulses.  Exam reveals no gallop and no friction rub.   No murmur heard. Pulmonary/Chest: Effort normal and breath sounds normal. No stridor. No respiratory distress. She has no wheezes. She has no rales. She exhibits no tenderness.  Abdominal: Soft. Bowel sounds are normal. She exhibits no distension and no mass. There is no  tenderness. There is no rebound and no guarding.  Musculoskeletal: Normal range of motion. She exhibits no edema or tenderness.  Lymphadenopathy:    She has no cervical adenopathy.  Neurological: She is alert and oriented to person, place, and time. She displays normal reflexes. She exhibits normal muscle tone. Coordination normal.  Skin: Skin is warm and dry. No rash noted. No erythema. No pallor.  Psychiatric: Her behavior is normal. Judgment and thought content normal.  Anxious  Nursing note and vitals reviewed.     ED Course  Procedures (including critical care time) DIAGNOSTIC STUDIES: Oxygen Saturation is 100% on RA, normal by my interpretation.    COORDINATION OF CARE: 3:50 AM Discussed treatment plan with pt at bedside and pt agreed to plan.   Labs Review Labs Reviewed  BASIC METABOLIC PANEL - Abnormal; Notable for the following:    Potassium 3.3 (*)    Glucose, Bld 100 (*)    All other components within normal limits  CBC  URINALYSIS, ROUTINE W REFLEX MICROSCOPIC (NOT AT [facility])  URINE RAPID DRUG SCREEN, HOSP PERFORMED  I-STAT TROPOININ, ED    Imaging Review Dg Chest 2 View  09/17/2014   CLINICAL DATA:  Awoke from sleep with chest pain.  EXAM: CHEST  2 VIEW  COMPARISON:  Radiographs and CT 09/09/2014  FINDINGS: The cardiomediastinal contours are normal. The lungs are clear. Pulmonary vasculature is normal. No consolidation, pleural effusion, or pneumothorax. No acute osseous abnormalities are seen.  IMPRESSION: No acute pulmonary process.   Electronically Signed   By: Rubye Oaks M.D.   On: 09/17/2014 03:10     EKG Interpretation   Date/Time:  Wednesday September 17 2014 02:30:54 EDT Ventricular Rate:  107 PR Interval:  152 QRS Duration: 93 QT Interval:  356 QTC Calculation: 475 R Axis:   79 Text Interpretation:  Sinus tachycardia Probable left atrial enlargement  Confirmed by Syann Cupples  MD, Chere Babson ([id number]) on 09/17/2014 3:13:16 AM      MDM   Final diagnoses:   Anxiety  Atypical chest pain  Gastroesophageal reflux disease, esophagitis presence not specified    I personally performed the services described in this documentation, which was scribed in my presence. The recorded information has been reviewed and is accurate.  59 year old female with ongoing issues of shortness of breath, tightness in her chest and throat, chills.  Symptoms have been intermittent since January.  Patient was seen on the 19th at [facility] with full evaluation including a CT angio chest.  At that time she had taken the last of her Xanax.  She has been on for many years.  Patient reports she is having some shaking of her hands and worsening symptoms.  She was told that she most likely had GERD and anxiety.  She reports that taking the Prilosec made her ill.  She has been taking the hydroxyzine without improvement.  She  has follow-up next week with psychologist and primary care doctor.   Marisa Severin, MD 09/17/14 [phone]

## 2014-09-17 NOTE — ED Notes (Signed)
Pt states that she feels like she is having acid reflux with chest pain and sob. Hx of same and was evaluated recently for the same. Alert and oriented.

## 2015-05-01 ENCOUNTER — Emergency Department (HOSPITAL_COMMUNITY): Payer: Medicaid Other

## 2015-05-01 ENCOUNTER — Encounter (HOSPITAL_COMMUNITY): Payer: Self-pay | Admitting: Vascular Surgery

## 2015-05-01 DIAGNOSIS — Z79899 Other long term (current) drug therapy: Secondary | ICD-10-CM | POA: Diagnosis not present

## 2015-05-01 DIAGNOSIS — R0602 Shortness of breath: Secondary | ICD-10-CM | POA: Insufficient documentation

## 2015-05-01 DIAGNOSIS — Z880 Allergy status to penicillin: Secondary | ICD-10-CM | POA: Diagnosis not present

## 2015-05-01 DIAGNOSIS — I10 Essential (primary) hypertension: Secondary | ICD-10-CM | POA: Insufficient documentation

## 2015-05-01 DIAGNOSIS — R531 Weakness: Secondary | ICD-10-CM | POA: Insufficient documentation

## 2015-05-01 DIAGNOSIS — R002 Palpitations: Secondary | ICD-10-CM | POA: Diagnosis not present

## 2015-05-01 DIAGNOSIS — R202 Paresthesia of skin: Secondary | ICD-10-CM | POA: Diagnosis not present

## 2015-05-01 DIAGNOSIS — R079 Chest pain, unspecified: Secondary | ICD-10-CM | POA: Insufficient documentation

## 2015-05-01 DIAGNOSIS — R05 Cough: Secondary | ICD-10-CM | POA: Diagnosis not present

## 2015-05-01 DIAGNOSIS — F419 Anxiety disorder, unspecified: Secondary | ICD-10-CM | POA: Insufficient documentation

## 2015-05-01 LAB — CBC
HCT: 39.2 % (ref 36.0–46.0)
HEMOGLOBIN: 13.3 g/dL (ref 12.0–15.0)
MCH: 30.7 pg (ref 26.0–34.0)
MCHC: 33.9 g/dL (ref 30.0–36.0)
MCV: 90.5 fL (ref 78.0–100.0)
Platelets: 285 10*3/uL (ref 150–400)
RBC: 4.33 MIL/uL (ref 3.87–5.11)
RDW: 12.4 % (ref 11.5–15.5)
WBC: 9.1 10*3/uL (ref 4.0–10.5)

## 2015-05-01 LAB — BASIC METABOLIC PANEL
Anion gap: 13 (ref 5–15)
BUN: 13 mg/dL (ref 6–20)
CO2: 27 mmol/L (ref 22–32)
Calcium: 9.5 mg/dL (ref 8.9–10.3)
Chloride: 106 mmol/L (ref 101–111)
Creatinine, Ser: 0.91 mg/dL (ref 0.44–1.00)
GFR calc Af Amer: >60 mL/min (ref >60)
GFR calc non Af Amer: >60 mL/min (ref >60)
GLUCOSE: 173 mg/dL — AB (ref 65–99)
POTASSIUM: 3.6 mmol/L (ref 3.5–5.1)
Sodium: 146 mmol/L — ABNORMAL HIGH (ref 135–145)

## 2015-05-01 NOTE — ED Notes (Signed)
Pt reports to the ED for eval of CP and SOB. She reports she has had this pain on and off x several months but it has been getting worse in the past few days (lost her brother 3 days ago). Pain radiates into her back. Pt was in the ED in [place] last year and was told she had a clogged artery. She was referred to cardiology but they did not call her. Pt A&Ox4, resp e/u, and skin warm and dry.

## 2015-05-02 ENCOUNTER — Emergency Department (HOSPITAL_COMMUNITY)
Admission: EM | Admit: 2015-05-02 | Discharge: 2015-05-02 | Disposition: A | Discharge status: Home / Self Care | Payer: Medicaid Other | Attending: Emergency Medicine | Admitting: Emergency Medicine

## 2015-05-02 DIAGNOSIS — F419 Anxiety disorder, unspecified: Secondary | ICD-10-CM

## 2015-05-02 DIAGNOSIS — R079 Chest pain, unspecified: Secondary | ICD-10-CM

## 2015-05-02 LAB — I-STAT TROPONIN, ED: TROPONIN I, POC: 0 ng/mL (ref 0.00–0.08)

## 2015-05-02 LAB — TSH: TSH: 2.801 u[IU]/mL (ref 0.350–4.500)

## 2015-05-02 MED ORDER — ALPRAZOLAM 0.5 MG PO TABS
0.5000 mg | ORAL_TABLET | Freq: Every evening | ORAL | Status: AC | PRN
Start: 2015-05-02 — End: ?

## 2015-05-02 NOTE — ED Provider Notes (Signed)
CSN: [id number]     Arrival date & time 05/01/15  2041 History  By signing my name below, I, Joyce Lynn, attest that this documentation has been prepared under the direction and in the presence of Joyce HongBrian Erian Rosengren, MD . Electronically Signed: Marisue HumbleMichelle Lynn, Scribe. 05/02/2015. 1:20 AM.   Chief Complaint  Patient presents with  . Chest Pain  . Shortness of Breath   The history is provided by the patient. No language interpreter was used.   HPI Comments:  Joyce Lynn is a 60 y.o. female with PMHx of HTN and panic attacks who presents to the Emergency Department complaining of gradual onset, intermittent, mild chest pain for the past few weeks, worsening in the past two days. Pt reports associated intermittent shortness of breath, generalized weakness, palpitations, tingling in both legs, and cough. She notes symptoms worsen with increased stress. Pt states she lost her brother three days ago and symptoms were reproduced when she picked up his death certificate. Pt walks 2-3 miles per day at leisurely pace with occasional jogging; she notes getting tired and short of breath when walking. She was seen at [facility] last year and told she had a clogged artery; pt states she had a chest x-ray and CAT scan. Pt did not follow-up with cardiologist at that time. She denies cardiac cath or h/o smoking. Pt denies leg swelling, fever, or h/o DM or cancer.  Past Medical History  Diagnosis Date  . Panic attacks   . Hypertension    Past Surgical History  Procedure Laterality Date  . Abdominal hysterectomy     No family history on file. Social History  Substance Use Topics  . Smoking status: Never Smoker   . Smokeless tobacco: None  . Alcohol Use: Yes     Comment: occ   OB History    No data available     Review of Systems  Constitutional: Negative for fever.  Respiratory: Positive for cough and shortness of breath.   Cardiovascular: Positive for chest pain and palpitations.  Negative for leg swelling.  Neurological: Positive for weakness and numbness.  All other systems reviewed and are negative.  Allergies  Penicillins  Home Medications   Prior to Admission medications   Medication Sig Start Date End Date Taking? Authorizing Provider  amLODipine (NORVASC) 10 MG tablet Take 10 mg by mouth daily.   Yes Historical Provider, MD  atorvastatin (LIPITOR) 40 MG tablet Take 40 mg by mouth daily.   Yes Historical Provider, MD  escitalopram (LEXAPRO) 10 MG tablet Take 10 mg by mouth daily.   Yes Historical Provider, MD  lisinopril (PRINIVIL,ZESTRIL) 5 MG tablet Take 5 mg by mouth daily.   Yes Historical Provider, MD  pantoprazole (PROTONIX) 40 MG tablet Take 40 mg by mouth daily.   Yes Historical Provider, MD  ALPRAZolam Prudy Feeler(XANAX) 0.5 MG tablet Take 1 tablet (0.5 mg total) by mouth at bedtime as needed for anxiety. 05/02/15   Joyce HongBrian Arrington Bencomo, MD   BP 153/86 mmHg  Pulse 77  Temp(Src) 97.8 F (36.6 C) (Oral)  Resp 20  SpO2 100% Physical Exam  Constitutional: She appears well-developed and well-nourished. No distress.  HENT:  Head: Normocephalic and atraumatic.  Mouth/Throat: Oropharynx is clear and moist. No oropharyngeal exudate.  Eyes: Conjunctivae and EOM are normal. Pupils are equal, round, and reactive to light. Right eye exhibits no discharge. Left eye exhibits no discharge. No scleral icterus.  Neck: Normal range of motion. Neck supple. No JVD present. No  thyromegaly present.  Cardiovascular: Normal rate, regular rhythm, normal heart sounds and intact distal pulses.  Exam reveals no gallop and no friction rub.   No murmur heard. Pulmonary/Chest: Effort normal and breath sounds normal. No respiratory distress. She has no wheezes. She has no rales.  Abdominal: Soft. Bowel sounds are normal. She exhibits no distension and no mass. There is no tenderness.  Musculoskeletal: Normal range of motion. She exhibits no edema or tenderness.  Lymphadenopathy:    She has no  cervical adenopathy.  Neurological: She is alert. Coordination normal.  Skin: Skin is warm and dry. No rash noted. No erythema.  Psychiatric:  Mildly anxious  Nursing note and vitals reviewed.   ED Course  Procedures  DIAGNOSTIC STUDIES:  Oxygen Saturation is 100% on RA, normal by my interpretation.    COORDINATION OF CARE:  1:14 AM Discussed EKG and lab results with pt. Will re-evaluate when troponin result returns. Discussed treatment plan with pt at bedside and pt agreed to plan.  Labs Review Labs Reviewed  BASIC METABOLIC PANEL - Abnormal; Notable for the following:    Sodium 146 (*)    Glucose, Bld 173 (*)    All other components within normal limits  CBC  TSH  I-STAT TROPOININ, ED    Imaging Review Dg Chest 2 View  05/01/2015  CLINICAL DATA:  Chest pain for 1 month, worse in the last few days. Shortness of breath. EXAM: CHEST  2 VIEW COMPARISON:  Radiograph 09/17/2014.  Chest CT 09/09/2014 FINDINGS: The cardiomediastinal contours are normal. The lungs are clear. Pulmonary vasculature is normal. No consolidation, pleural effusion, or pneumothorax. No acute osseous abnormalities are seen. IMPRESSION: No acute pulmonary process. Electronically Signed   By: Joyce Lynn M.D.   On: 05/01/2015 21:19   I have personally reviewed and evaluated these images and lab results as part of my medical decision-making.   EKG Interpretation   Date/Time:  Friday May 01 2015 20:48:10 EST Ventricular Rate:  115 PR Interval:  138 QRS Duration: 82 QT Interval:  322 QTC Calculation: 445 R Axis:   94 Text Interpretation:  Sinus tachycardia with occasional Premature  ventricular complexes Rightward axis Septal infarct , age undetermined  Abnormal ECG Since last tracing Premature ventricular complexes NOW SEEN  Confirmed by Amaira Safley  MD, Nell Schrack ([id number]) on 05/02/2015 1:01:31 AM      MDM   Final diagnoses:  Chest pain, unspecified chest pain type  Anxiety   I personally performed  the services described in this documentation, which was scribed in my presence. The recorded information has been reviewed and is accurate.    The patient has intermittent episodes of chest pain and shortness of breath which have acutely worsened now that she has lost her brother, this is exacerbated today when she went to pick up the death certificate. I do not see any signs of acute ischemia, her EKG is essentially unchanged, her lab work is normal including her troponin which makes acute coronary syndrome much less likely. The patient was informed of all these results, I have encouraged her strongly to follow-up with a cardiologist and the phone number was given. She expressed understanding.   Joyce Hong, MD 05/02/15 [phone]

## 2015-05-02 NOTE — Discharge Instructions (Signed)

## 2015-09-22 HISTORY — PX: VASCULAR SURGERY: SHX849

## 2016-02-10 IMAGING — CT CT HEAD W/O CM
2 series · 16 of 30 positions shown, 20 images · non-contrast
Comparison: None.

CLINICAL DATA: Near syncope and shortness of breath

EXAM:
CT HEAD WITHOUT CONTRAST
TECHNIQUE: Contiguous axial images were obtained from the base of the skull
through the vertex without intravenous contrast.

[Series 2: head w/o · axial · non-contrast · 0.45mm/px · z∈[-145,-20]mm · 13 of 31 slices shown, 17 images]
[im 3/31  brain]
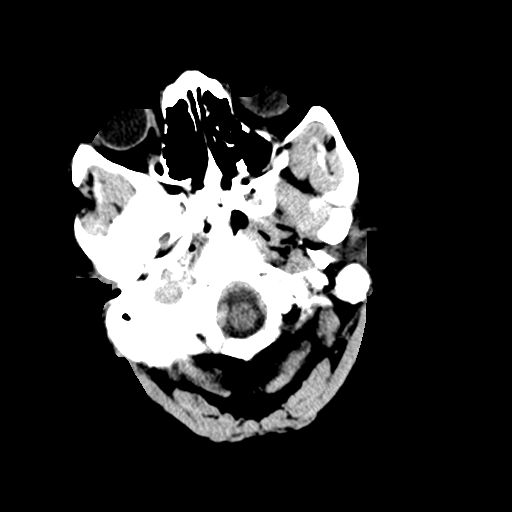
[im 3/31  bone]
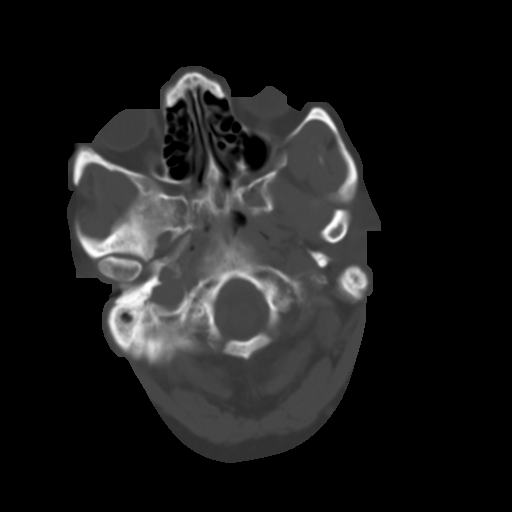
[im 5/31  brain]
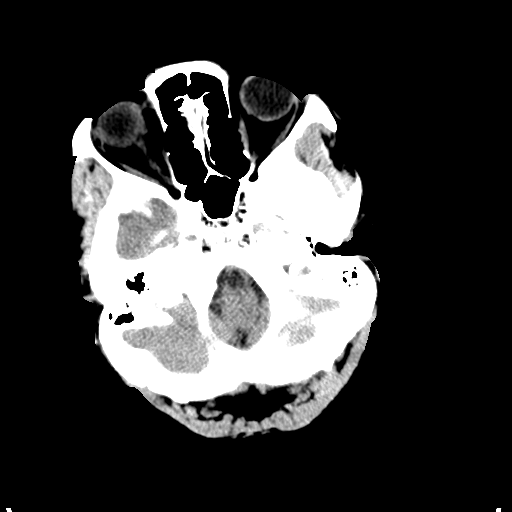
[im 7/31  brain]
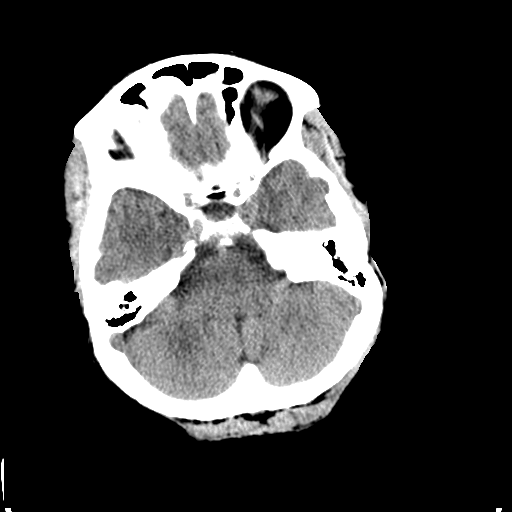
[im 9/31  brain]
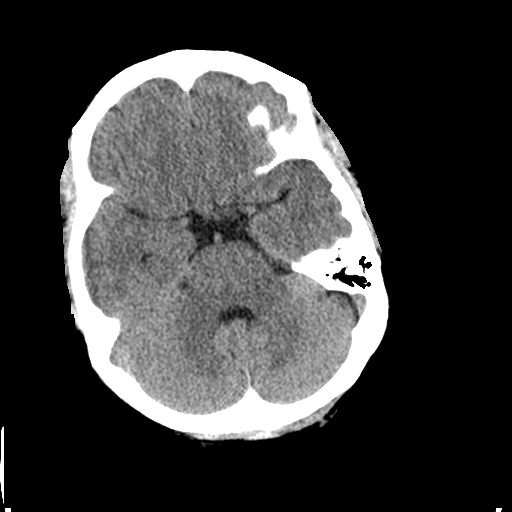
[im 11/31  brain]
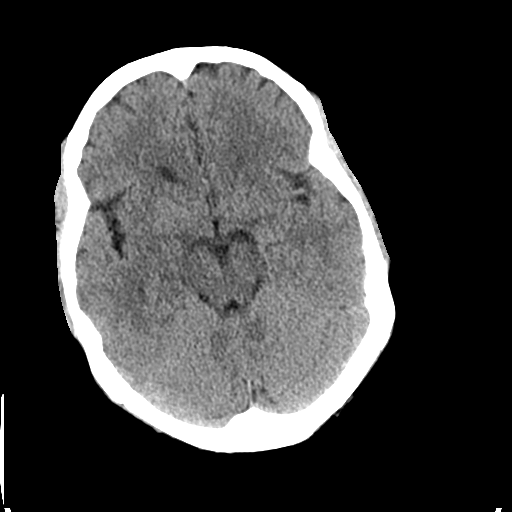
[im 11/31  bone]
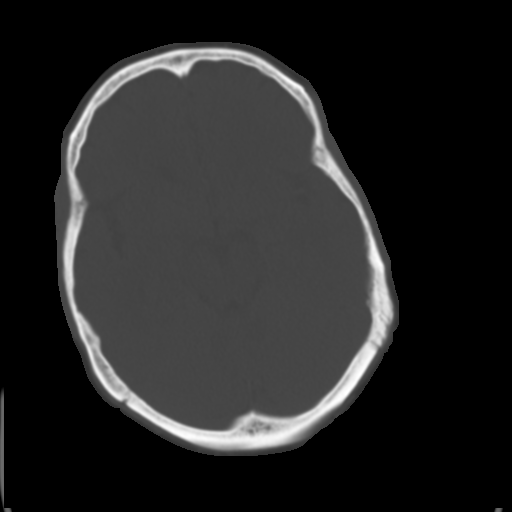
[im 13/31  brain]
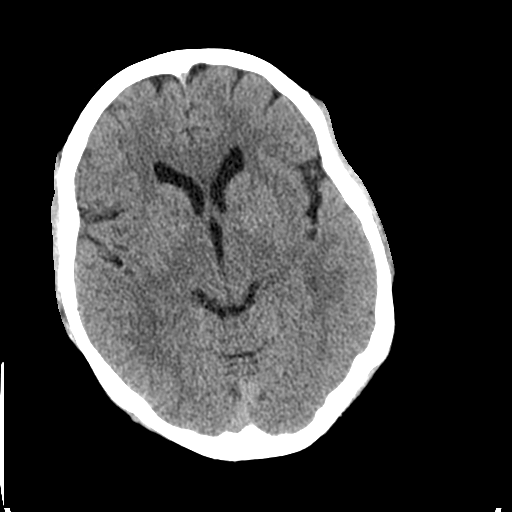
[im 16/31  brain]
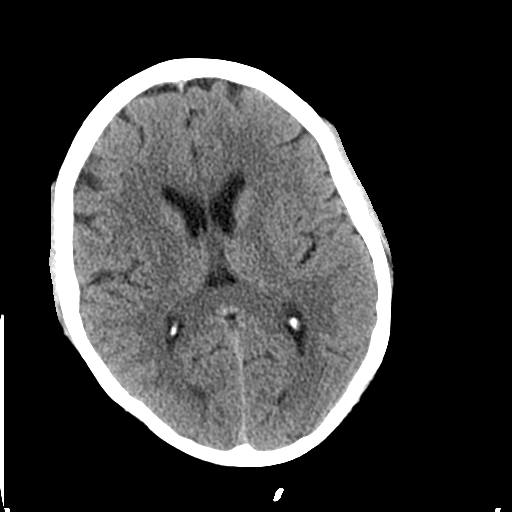
[im 18/31  brain]
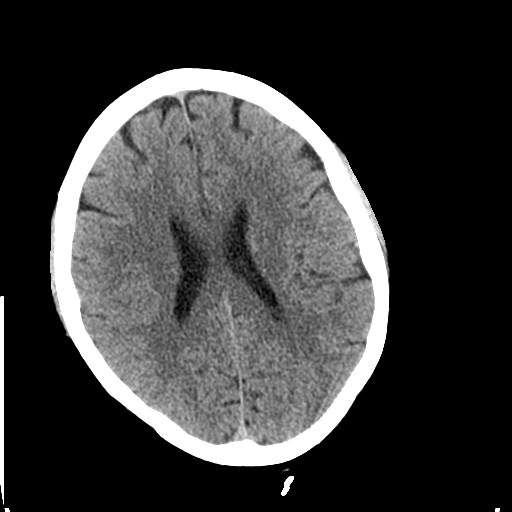
[im 20/31  brain]
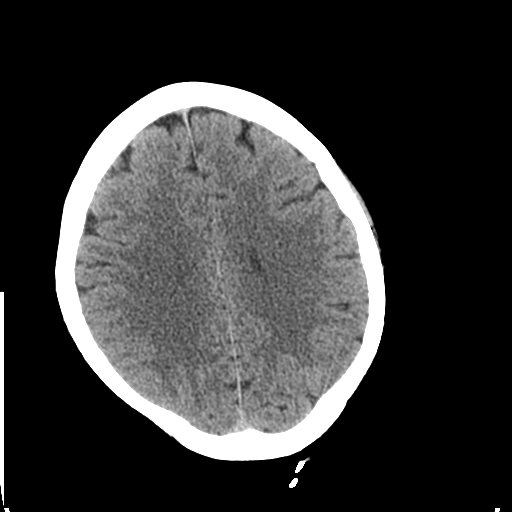
[im 20/31  bone]
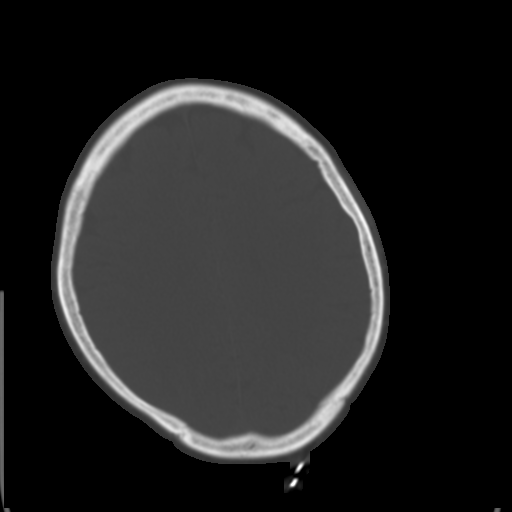
[im 22/31  brain]
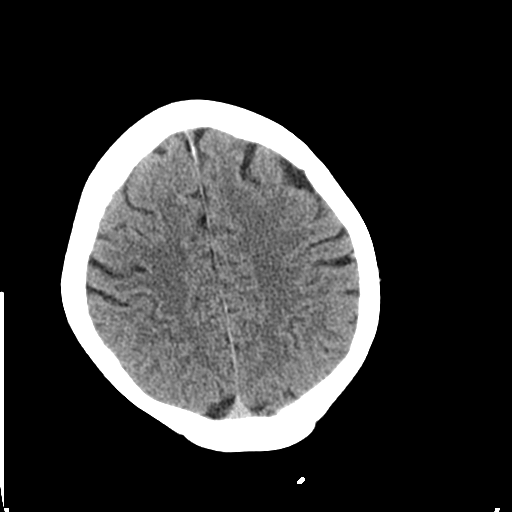
[im 24/31  brain]
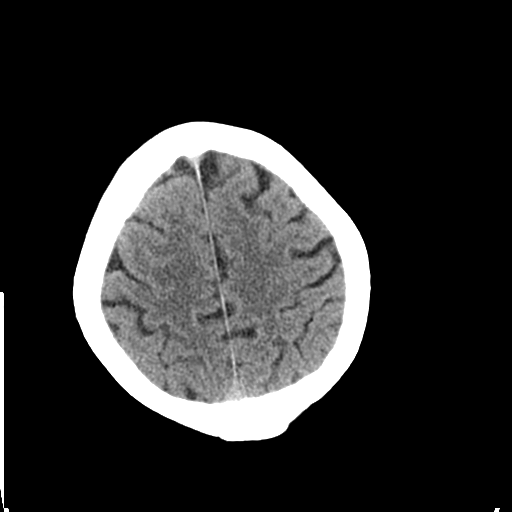
[im 26/31  brain]
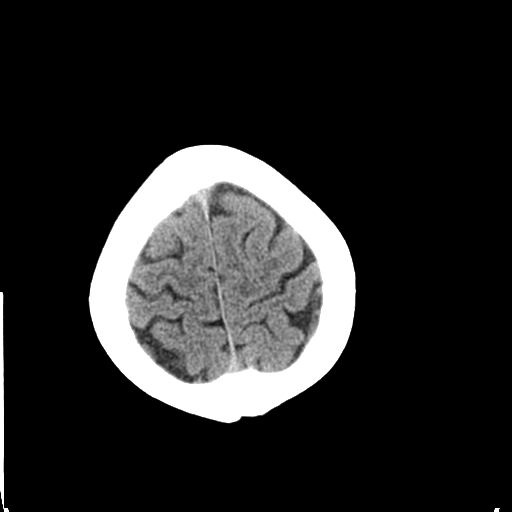
[im 28/31  brain]
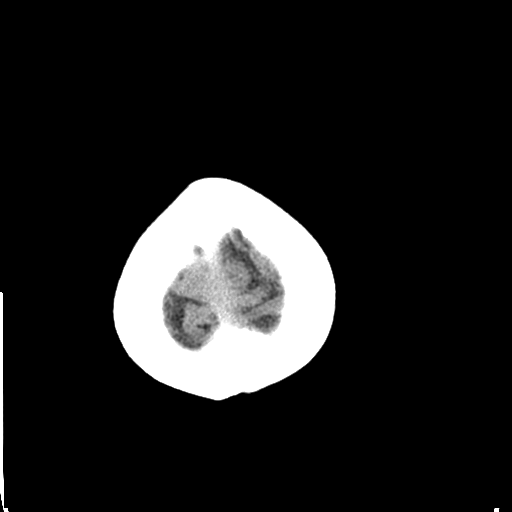
[im 28/31  bone]
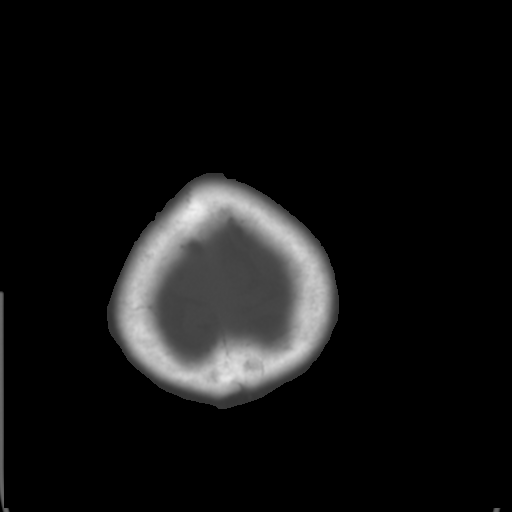

[Series 3: bone windows · axial · 0.45mm/px · z∈[-145,-105]mm · 3 of 31 slices shown]
[im 3/31  bone]
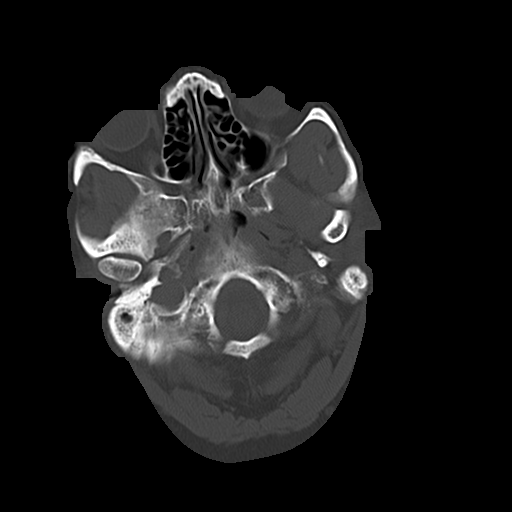
[im 7/31  bone]
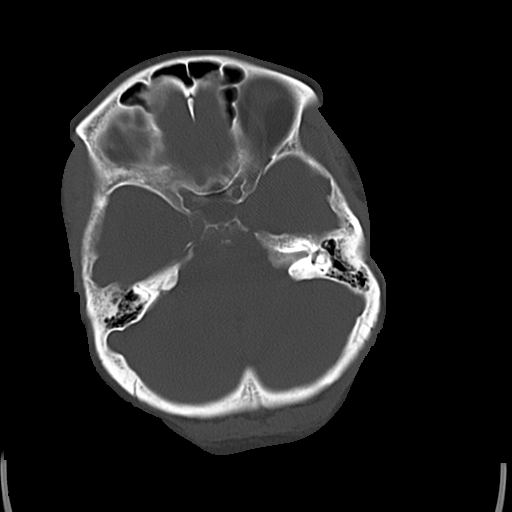
[im 11/31  bone]
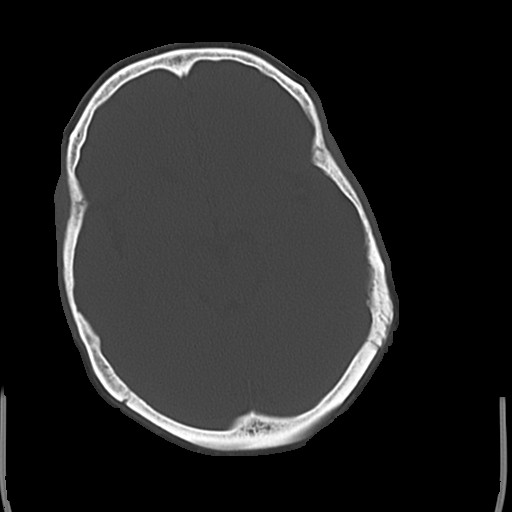

[16 of 30 positions shown; findings below may reference images not displayed]

FINDINGS: Skull and Sinuses:Negative for fracture or destructive process. The
mastoids, middle ears, and imaged paranasal sinuses are clear.

Orbits: Bilateral cataract resection.

Brain: No evidence of acute infarction, hemorrhage, hydrocephalus,
or mass lesion/mass effect.
IMPRESSION: Negative head CT.

## 2016-04-12 ENCOUNTER — Ambulatory Visit: Payer: Medicaid Other | Attending: Physician Assistant | Admitting: Physical Therapy

## 2016-04-12 ENCOUNTER — Encounter: Payer: Self-pay | Admitting: Physical Therapy

## 2016-04-12 DIAGNOSIS — M62838 Other muscle spasm: Secondary | ICD-10-CM | POA: Diagnosis present

## 2016-04-12 DIAGNOSIS — M542 Cervicalgia: Secondary | ICD-10-CM | POA: Diagnosis present

## 2016-04-12 DIAGNOSIS — R293 Abnormal posture: Secondary | ICD-10-CM

## 2016-04-12 NOTE — Therapy (Signed)
[facility] [address] [place], [place], [address] Phone: [phone]   Fax:  [phone]  Physical Therapy Evaluation  Patient Details  Name: Joyce Lynn MRN: [id number] Date of Birth: 11/04/1955 Referring Provider: Dani GobbleMark Payne PA-C  Encounter Date: 04/12/2016      PT End of Session - 04/12/16 0848    Visit Number 1   Number of Visits 1   Date for PT Re-Evaluation 04/13/16   PT Start Time 0800   PT Stop Time 0847   PT Time Calculation (min) 47 min   Activity Tolerance Patient tolerated treatment well   Behavior [facility] assessed/performed      Past Medical History:  Diagnosis Date  . Allergy   . GERD (gastroesophageal reflux disease)   . Hyperlipidemia   . Hypertension   . Panic attacks     Past Surgical History:  Procedure Laterality Date  . ABDOMINAL HYSTERECTOMY    . VASCULAR SURGERY  09/2015   stint placement     There were no vitals filed for this visit.       Subjective Assessment - 04/12/16 0809    Subjective pt is a 61 y.o F With CC of neck pain that goes into both hands that started about 6-7 months that began insidiously. reports the N/T starts typically at night and occasionally during the day, pain in the neck that goes to the shoulder region.  no report of HA or tinnitis/ dizziness.  reports no changes in the pain since onset.    How long can you sit comfortably? unlimited   How long can you stand comfortably? 5-10 min  occasional loss of balance   How long can you walk comfortably? unlimited   Diagnostic tests MRI and x-ray   Patient Stated Goals ease the pain, get back to working out.    Currently in Pain? Yes   Pain Score 5    Pain Location Neck  at worst 8/10   Pain Orientation Right;Left  R>L   Pain Descriptors / Indicators Aching;Tightness;Sore;Pins and needles   Pain Type Chronic pain   Pain Radiating Towards pain into both shoulder blades, and N/T in to both  hands   Pain Onset More than a month ago   Pain Frequency Constant   Aggravating Factors  doing hair (keeping hands above head), cleaning, using the arms, turning to the right,   Pain Relieving Factors exercising, heating pad, ice pack,             [facility] PT Assessment - 04/12/16 0759      Assessment   Medical Diagnosis Cervical pain   Referring Provider Dani GobbleMark Payne PA-C   Onset Date/Surgical Date --  6-7 months   Hand Dominance Right   Next MD Visit make one PRN   Prior Therapy no     Precautions   Precautions None     Restrictions   Weight Bearing Restrictions No     Balance Screen   Has the patient fallen in the past 6 months No   Has the patient had a decrease in activity level because of a fear of falling?  No   Is the patient reluctant to leave their home because of a fear of falling?  No     Home Nurse, mental healthnvironment   Living Environment Private residence   Living Arrangements Spouse/significant other   Available Help at Discharge Available PRN/intermittently   Type of Home House   Home Access Level  entry   Home Layout One level   Home Equipment Walker - 2 wheels     Prior Function   Level of Independence Independent;Independent with basic ADLs   Vocation On disability   Leisure exercise, spending time with dog, volunteering     Cognition   Overall Cognitive Status Within Functional Limits for tasks assessed     Posture/Postural Control   Posture/Postural Control Postural limitations   Postural Limitations Rounded Shoulders;Forward head     ROM / Strength   AROM / PROM / Strength AROM;Strength     AROM   AROM Assessment Site Cervical   Cervical Flexion 42  ERP   Cervical Extension 52  PDM   Cervical - Right Side Bend 40  ERP   Cervical - Left Side Bend 38  ERP   Cervical - Right Rotation 73  ERP   Cervical - Left Rotation 68     Strength   Strength Assessment Site --   Right/Left Shoulder --     Palpation   Palpation comment bil upper trap/  levator scapulae tightness with multiple triggers, scalenes/SCM on the R     Special Tests    Special Tests Cervical   Cervical Tests Dictraction;Spurling's;other     Spurling's   Findings Negative     Distraction Test   Findngs Positive     other    Findings Positive   Comment ULTT                           PT Education - 04/12/16 0848    Education provided Yes   Education Details Evalaution findings, HEP with proper form and treatment rationale, anatomy of condition, [facility] informaiton / handout.    Person(s) Educated Patient   Methods Explanation;Verbal cues;Handout   Comprehension Verbalized understanding;Returned demonstration;Verbal cues required                    Plan - 04/12/16 0849    Clinical Impression Statement Joyce Lynn presents to OPPT as a moderate complexity based on involved PMHx, constant pain and exam findings with CC of cervical pain with bil UE N/T. Functional cervical AROM, with end range pain in almost all motions. positive 2/4 cluster testing for cervical radiculopathy indicating moderate probability. when performed HEP exercises/ stretches she reported feeling more relaxed and decreased N/T in the hands. provided and reviewed HEP with proper form and provided [facility] handout.   PT Frequency One time visit   PT Next Visit Plan Medicaid 1 x visit   PT Home Exercise Plan see HEP handout   Consulted and Agree with Plan of Care Patient      Patient will benefit from skilled therapeutic intervention in order to improve the following deficits and impairments:  Pain, Improper body mechanics, Postural dysfunction, Decreased strength, Decreased endurance, Decreased range of motion, Increased fascial restricitons  Visit Diagnosis: Cervicalgia -  PT plan of care cert/re-cert  Abnormal posture -PT plan of care cert/re-cert  Other muscle spasm - PT plan of care cert/re-cert     Problem List There are no active  problems to display for this patient.  Lulu Riding PT, DPT, LAT, ATC  04/12/16  9:00 AM      [facility] [address] [place], [place], [address] Phone: [phone]   Fax:  [phone]  Name: Joyce Lynn MRN: [id number] Date of Birth: [birth year]

## 2017-04-19 IMAGING — CR DG CHEST 2V
2 series · 2 of 2 positions shown · non-contrast
Comparison: Radiograph 09/17/2014.  Chest CT 09/09/2014

CLINICAL DATA: Chest pain for 1 month, worse in the last few days.
Shortness of breath.

EXAM:
CHEST  2 VIEW

[chest pa]
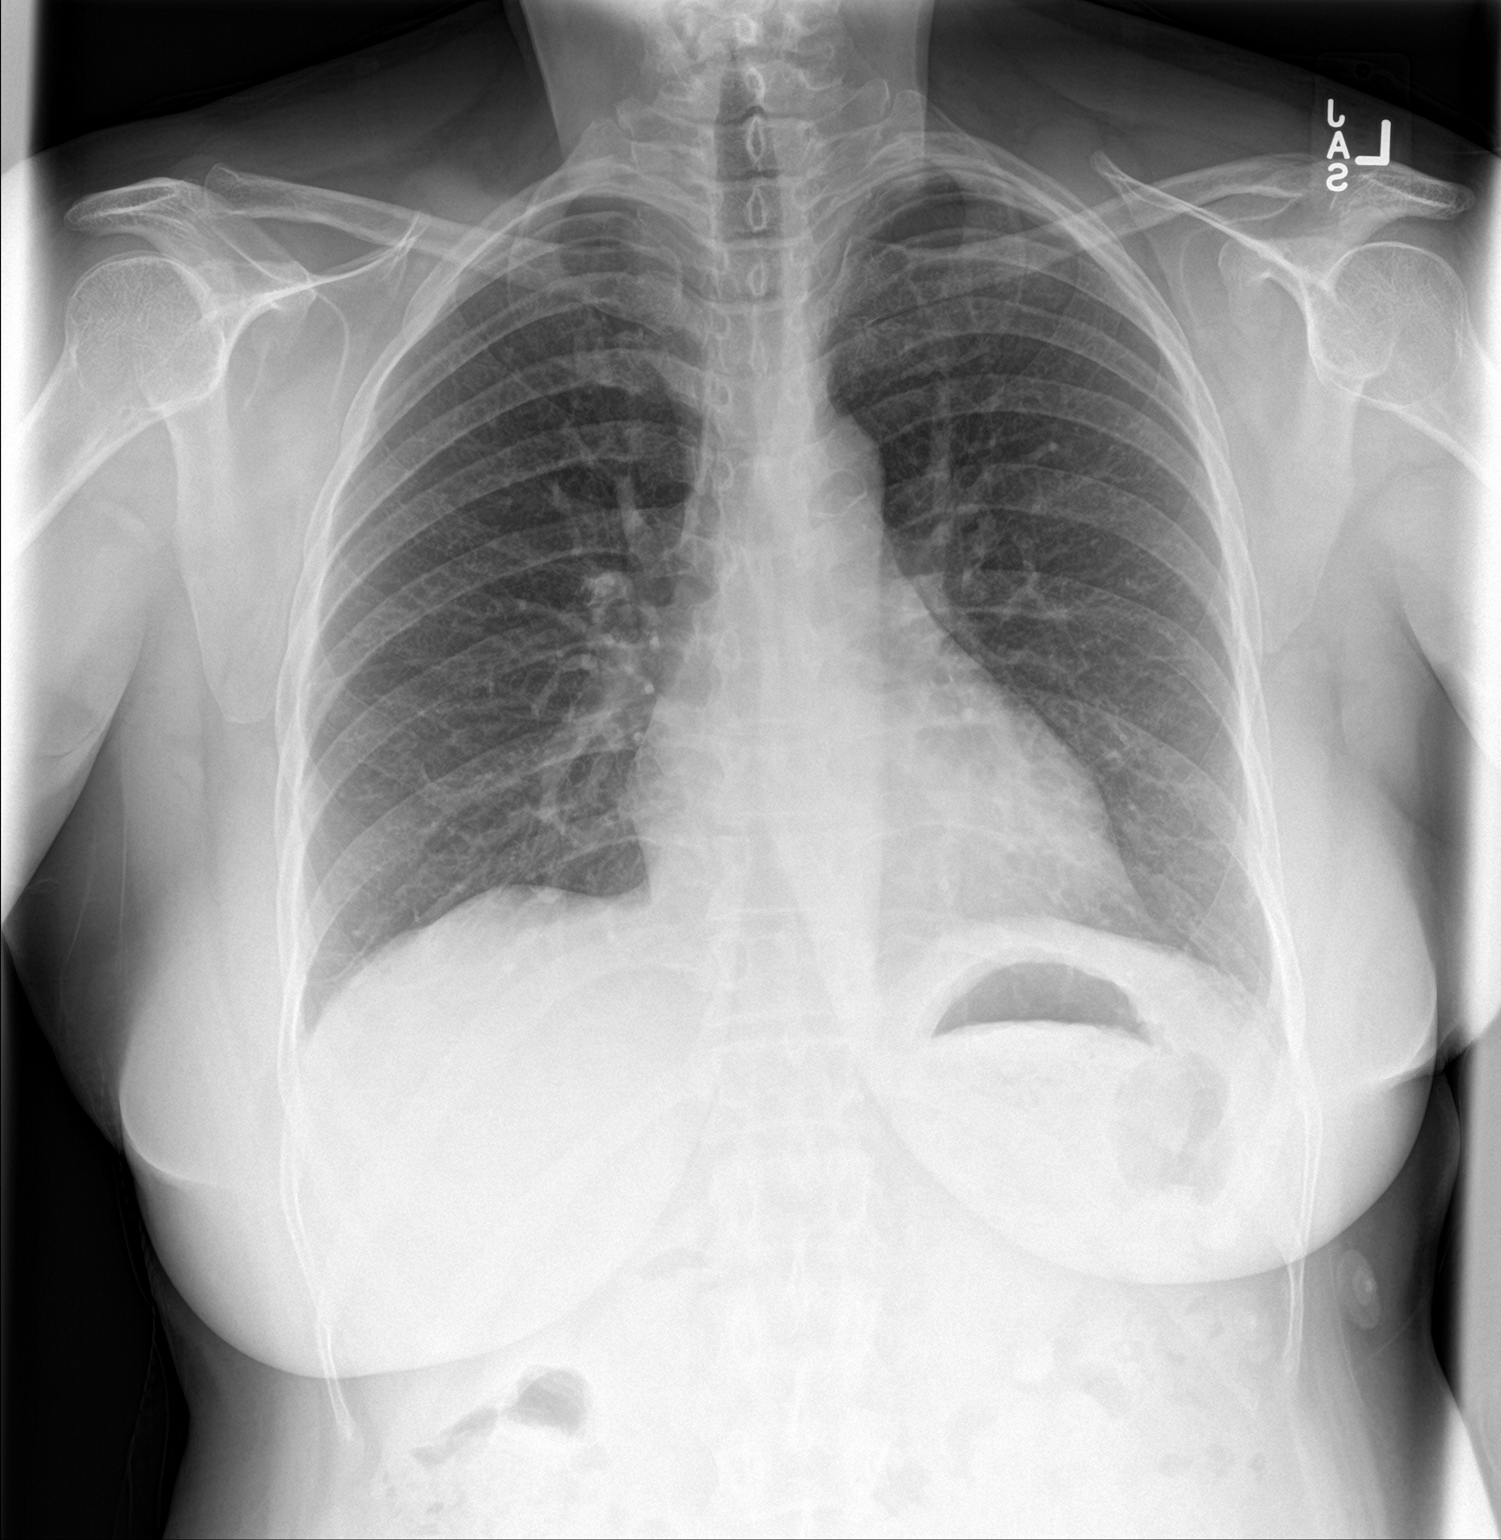

[chest lat]
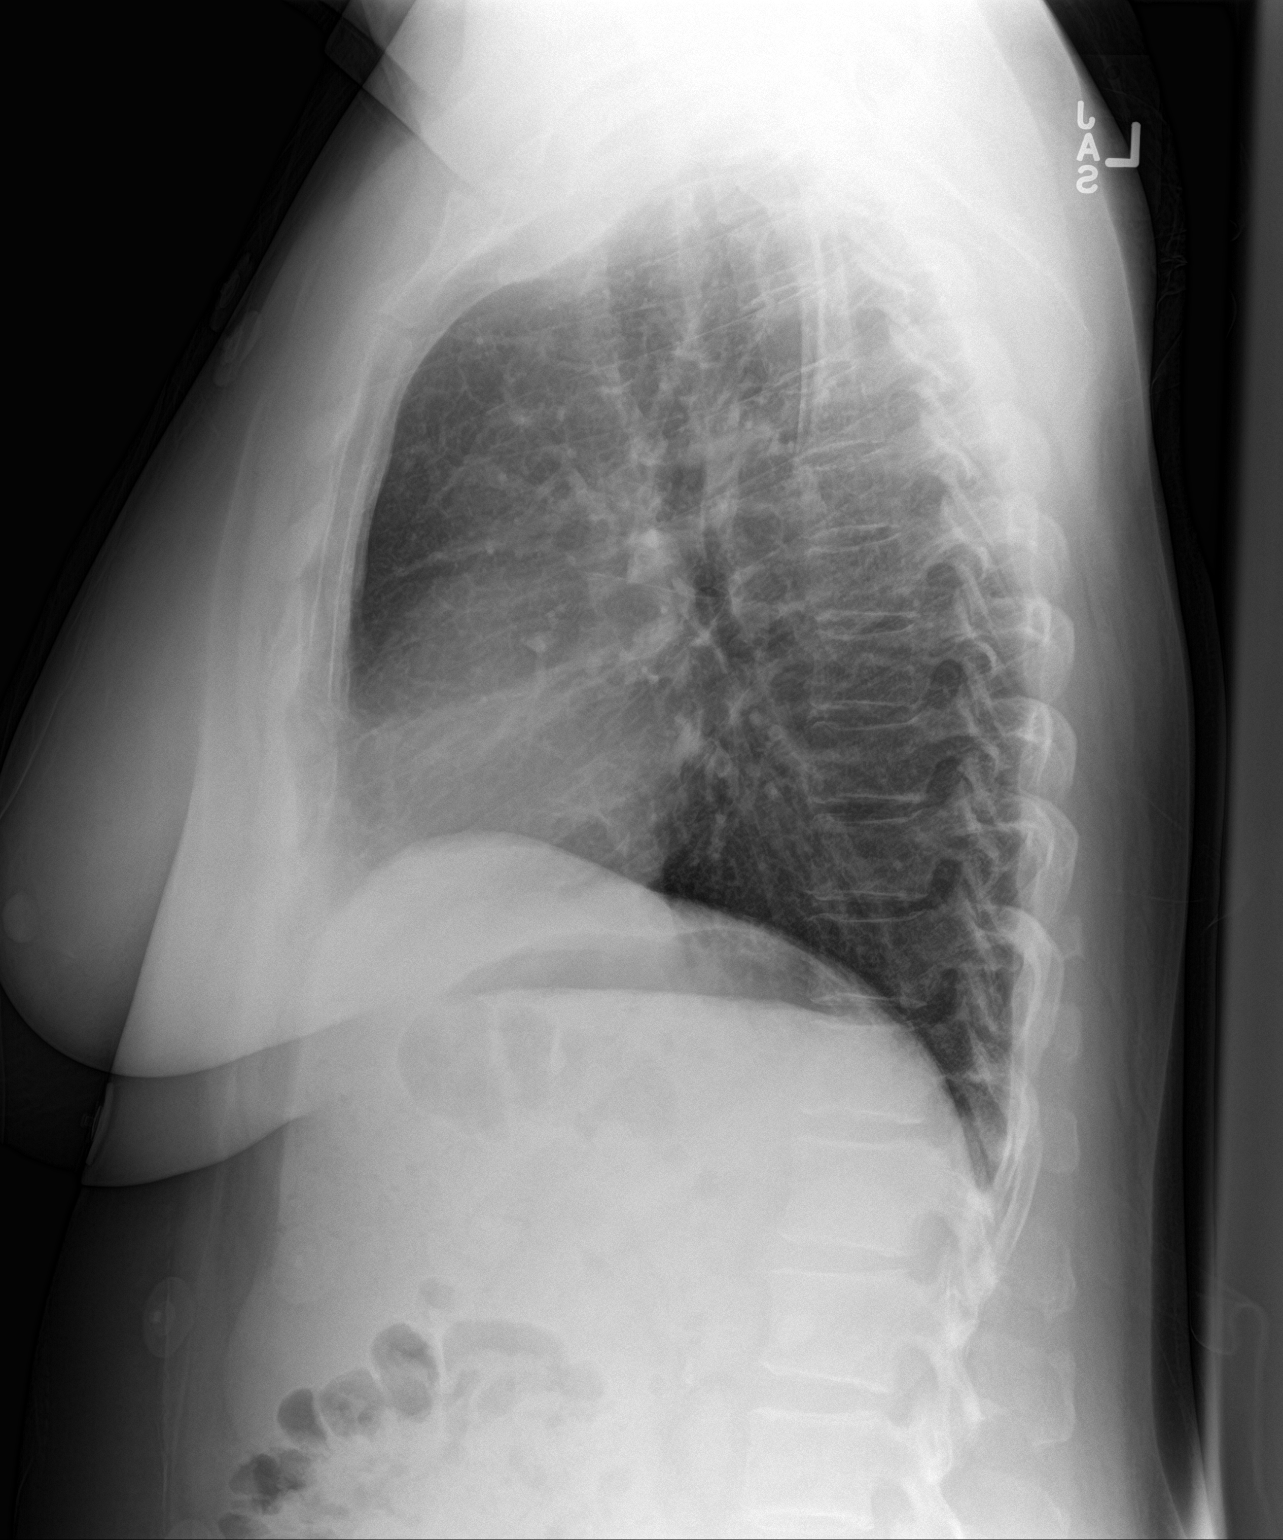

[2 of 2 positions shown; findings below may reference images not displayed]

FINDINGS: The cardiomediastinal contours are normal. The lungs are clear.
Pulmonary vasculature is normal. No consolidation, pleural effusion,
or pneumothorax. No acute osseous abnormalities are seen.
IMPRESSION: No acute pulmonary process.

## 2018-03-23 ENCOUNTER — Other Ambulatory Visit: Payer: Self-pay | Admitting: Physician Assistant

## 2018-03-23 DIAGNOSIS — Z1231 Encounter for screening mammogram for malignant neoplasm of breast: Secondary | ICD-10-CM

## 2018-05-30 ENCOUNTER — Ambulatory Visit: Payer: Self-pay

## 2018-08-30 ENCOUNTER — Ambulatory Visit
Admission: RE | Admit: 2018-08-30 | Discharge: 2018-08-30 | Disposition: A | Discharge status: Home / Self Care | Payer: Medicaid Other | Source: Ambulatory Visit | Attending: Physician Assistant | Admitting: Physician Assistant

## 2018-08-30 DIAGNOSIS — Z1231 Encounter for screening mammogram for malignant neoplasm of breast: Secondary | ICD-10-CM

## 2019-04-03 ENCOUNTER — Encounter: Payer: Self-pay | Admitting: Physical Therapy

## 2019-04-03 ENCOUNTER — Ambulatory Visit: Payer: Medicaid Other | Attending: Physician Assistant | Admitting: Physical Therapy

## 2019-04-03 ENCOUNTER — Other Ambulatory Visit: Payer: Self-pay

## 2019-04-03 DIAGNOSIS — R252 Cramp and spasm: Secondary | ICD-10-CM | POA: Diagnosis present

## 2019-04-03 DIAGNOSIS — R278 Other lack of coordination: Secondary | ICD-10-CM | POA: Insufficient documentation

## 2019-04-03 NOTE — Patient Instructions (Addendum)
   The "Pelvic Drop" to Release Pelvic Floor Tension: Three Visualizations     Guided Meditation for Pelvic Floor Relaxation  [employer]   Pelvic Floor Release Stretches  [employer]    Pelvic Floor Release Stretches (NEW)  [employer]    Access Code: [email or web address]  URL: [email or web address]  Date: 04/03/2019  Prepared by: Loistine Simas Avrom Robarts   Exercises Sidelying Diaphragmatic Breathing - 3 sets - 3 min hold - 1x daily - 7x weekly

## 2019-04-03 NOTE — Therapy (Signed)
[facility] [address] [place], [place], [address] Phone: [phone]   Fax:  [phone]  Physical Therapy Evaluation  Patient Details  Name: Joyce Lynn MRN: [id number] Date of Birth: 08/29/1955 Referring Provider (PT): Harrison Mons, [place]   Encounter Date: 04/03/2019  PT End of Session - 04/03/19 1047    Visit Number  1    Date for PT Re-Evaluation  05/29/19    Authorization Type  Medicaid - submitting for auth    PT Start Time  4627    PT Stop Time  1015    PT Time Calculation (min)  42 min    Activity Tolerance  Patient tolerated treatment well    Behavior During Therapy  [facility] for tasks assessed/performed       Past Medical History:  Diagnosis Date  . Allergy   . GERD (gastroesophageal reflux disease)   . Hyperlipidemia   . Hypertension   . Panic attacks     Past Surgical History:  Procedure Laterality Date  . ABDOMINAL HYSTERECTOMY    . VASCULAR SURGERY  09/2015   stint placement     There were no vitals filed for this visit.   Subjective Assessment - 04/03/19 0938    Subjective  Pt reports proximal groin pain that started approx 6 months ago without known injury.  Pain is worse after sitting x 1 hour, lying in butterfly position or after walking x 3 miles.  It sometimes improves with walking.    Pertinent History  Past Surgical Hx: partial hysterectomy    Limitations  Sitting    How long can you sit comfortably?  1 hour    Patient Stated Goals  get rid of pain, be able to achieve positioning for sexual activity that is more comfortable, sitting to watch TV without pain, vaginal intercourse without pain    Currently in Pain?  No/denies   can get to an 8/10, described as sharp        [facility] PT Assessment - 04/03/19 0001      Assessment   Medical Diagnosis  M79.18 (ICD-10-CM) - Myalgia, other site (pelvic floor)    Referring Provider (PT)  Harrison Mons, PA    Onset Date/Surgical Date   --   6 mos ago   Next MD Visit  --   6 mos   Prior Therapy  no      Precautions   Precautions  None      Restrictions   Weight Bearing Restrictions  No      Balance Screen   Has the patient fallen in the past 6 months  No      [place] residence    Living Arrangements  Other relatives   cousin   Home Access  Stairs to enter    Entrance Stairs-Number of Steps  [place]  One level      Prior Function   Level of Independence  Independent    Vocation  On disability    Leisure  walking, play with dog, sew      Cognition   Overall Cognitive Status  Within Functional Limits for tasks assessed      Functional Tests   Functional tests  Squat;Single leg stance      Squat   Comments  able to perform deep squat with control and without pain      Single Leg Stance   Comments  able to balance in SLS bil      ROM / Strength   AROM / PROM / Strength  AROM;Strength      AROM   Overall AROM Comments  lumbar ROM WNL, bil hip ER limited 50%, hip abd limited 30%, hip flexion limited 20%      Strength   Overall Strength Comments  5/5 throughout bil LEs      Flexibility   Soft Tissue Assessment /Muscle Length  yes   bil adductors limited 30%   Piriformis  limited 30% bil   gluteals limited 30% bil     Palpation   Palpation comment  tender: proximal adductors bil, ischiocavernosus, bulbocavernosus bil      Special Tests    Special Tests  Hip Special Tests    Hip Special Tests   Anterior Hip Impingement Test;Patrick (FABER) Test      Luisa Hart (FABER) Test   Findings  Positive    Comments  bil, mild      Anterior Hip Impingement Test    Findings  Positive    Comments  bil in flexion/adduction/IR quadrant      Ambulation/Gait   Gait Comments  no gait deviations                Objective measurements completed on examination: See above findings.    Pelvic Floor Special Questions - 04/03/19 0001    Prior  Pelvic/Prostate Exam  Yes    Result Pelvic/Prostate Exam   negative    Prior Pregnancies  No    Currently Sexually Active  No    History of sexually transmitted disease  No    Urinary Leakage  No    Urinary urgency  No    Fecal incontinence  No    Falling out feeling (prolapse)  No    External Perineal Exam  PT explained exam procedure and obtained verbal consent    Skin Integrity  Intact    Scar  none    Perineal Body/Introitus   Elevated    External Palpation  tender ischiocavernosus and bulbocaversosus bil    Prolapse  None    Pelvic Floor Internal Exam  PT explained exam procedure and obtained verbal consent    Exam Type  Vaginal    Sensation  intact    Palpation  non-tender    Strength  good squeeze, good lift, able to hold agaisnt strong resistance    Strength # of reps  6   7 quick flicks in 10 sec   Strength # of seconds  10    Tone  increased tone throughout vaginal canal, able to tolerate single finger full depth       [facility] Adult PT Treatment/Exercise - 04/03/19 0001      Ambulation/Gait   Gait Pattern  Within Functional Limits      Posture/Postural Control   Posture/Postural Control  Postural limitations    Postural Limitations  Decreased lumbar lordosis      High Level Balance   High Level Balance Comments  able to stand in SLS bil      Self-Care   Self-Care  Other Self-Care Comments    Other Self-Care Comments   positioning for hip support with pillows when lying reclined in hooklying or use sides of bathtub, education on pelvic floor tension, visualization for PF release, pelvic yoga videos on YouTube, diaphragmatic breathing for PF release in SL  PT Long Term Goals - 04/03/19 1033      PT LONG TERM GOAL #1   Title  Pt will be ind in HEP for pelvic release, core stabilization with breath control, and LE ROM/stretching to improve pain and mobility.    Baseline  no knowledge    Time  8    Period  Weeks    Status  New     Target Date  05/29/19      PT LONG TERM GOAL #2   Title  Pt will achieve LE flexibility to WNL to improve hip mobility and reduce undue strain on pelvic region.    Baseline  hip flexibility limited by 30% (adductors, piriformis, gluteals, hip flexors), bil hip ER limited 50%    Time  8    Period  Weeks    Status  New    Target Date  05/29/19      PT LONG TERM GOAL #3   Title  Pt will be able to sit x 1 hour with pain not to exceed 3/10 in pelvic region to improve tolerance of computer work.    Baseline  7/10 pain with sitting x 1 hour    Time  8    Period  Weeks    Status  New    Target Date  05/29/19      PT LONG TERM GOAL #4   Title  Pt will be able to walk x 3 miles at least 3 days per week without exacerbation of groin pain.    Baseline  pain reaches 8/10 after 3 mile walk    Time  8    Period  Weeks    Status  New    Target Date  05/29/19      PT LONG TERM GOAL #5   Title  Pt will be able to use level 7 or 8 (second largest or largest) dilator without pain to prepare for vaginal penetration.    Baseline  Can tolerate single finger only during internal pelvic exam.    Time  8    Period  Weeks    Status  New    Target Date  05/29/19             Plan - 04/03/19 1047    Clinical Impression Statement  Pt is a pleasant female referred to PT for myalgia of the pelvic floor.  Pt started experiencing proximal groin pain approx 6 mos ago without injury or change of activity.  Pain ranges from 0-8/10 and is sharp when pain occurs.  She has pain with stairs, sitting x 1 hour, during and after walking x 3 miles, and with efforts to stretch or support inner thighs (relclined hooklying position) during positioning for self-clitoral stimulation.  She has signif tenderness to proximal adductors, ischiocavernosus and bulbocavernosus bil.  Hip ROM and flexibility are signif restricted with + FABER and anterior impingement test in flexion/adduction/IR.  She has increased resting tone and  tension in pelvic floor with elevated pernieal body.  She was able to tolerate a single finger internal assessment of PF strength and demo'd 3/5 strength.  She has trouble bulging her pelvic floor.  Tension in pelvic floor allowed for comfort with single finger assessment but PT did not attempt second finger due to tightness.  Pt is not currently sexually active but is worried that with the tension she has in the pelvic floor and vaginal canal that it will be painful if she attempts intercourse.  Pt  will benefit from skilled PT for Pt education, stretching, release techniques, coordination training of PF and development of a HEP.    Personal Factors and Comorbidities  Profession    Comorbidities  on disability    Examination-Activity Limitations  Sit;Stairs;Locomotion Level    Examination-Participation Restrictions  Interpersonal Relationship;Community Activity    Stability/Clinical Decision Making  Stable/Uncomplicated    Clinical Decision Making  Low    Rehab Potential  Excellent    PT Frequency  1x / week    PT Duration  8 weeks    PT Treatment/Interventions  ADLs/Self Care Home Management;Biofeedback;Cryotherapy;Electrical Stimulation;Moist Heat;Patient/family education;Neuromuscular re-education;Therapeutic exercise;Therapeutic activities;Functional mobility training;Stair training;Manual techniques;Passive range of motion;Dry needling;Joint Manipulations;Spinal Manipulations    PT Next Visit Plan  f/u on fem fusion and SL diaph breathing, supported knees when reclined, Julva sample, build HEP for pelvic floor and adductor stretching, teach introitus self stretching, dilator intro    PT Home Exercise Plan  Access Code: [email or web address], (SL diaphragmatic breathing), Fem Fusion pelvic meditation/release/pelvic yoga    Consulted and Agree with Plan of Care  Patient       Patient will benefit from skilled therapeutic intervention in order to improve the following deficits and impairments:  Hypomobility,  Decreased range of motion, Postural dysfunction, Increased fascial restricitons, Increased muscle spasms, Impaired flexibility, Pain, Impaired tone  Visit Diagnosis: Other lack of coordination - Plan: PT plan of care cert/re-cert  Cramp and spasm - Plan: PT plan of care cert/re-cert     Problem List There are no problems to display for this patient.   Loistine Simas Oluwatobi Ruppe, PT 04/03/19 3:30 PM   [facility] [address], [address] [place], [place], [address] Phone: [phone]   Fax:  [phone]  Name: RANEEN JAFFER MRN: [id number] Date of Birth: January 19, 1956

## 2019-04-15 ENCOUNTER — Ambulatory Visit: Payer: Medicaid Other | Admitting: Physical Therapy

## 2019-04-15 ENCOUNTER — Encounter: Payer: Self-pay | Admitting: Physical Therapy

## 2019-04-15 ENCOUNTER — Other Ambulatory Visit: Payer: Self-pay

## 2019-04-15 DIAGNOSIS — R252 Cramp and spasm: Secondary | ICD-10-CM

## 2019-04-15 DIAGNOSIS — R278 Other lack of coordination: Secondary | ICD-10-CM

## 2019-04-15 NOTE — Therapy (Signed)
[facility] [address] [place], [place], [address] Phone: [phone]   Fax:  [phone]  Physical Therapy Treatment  Patient Details  Name: Joyce Lynn MRN: [id number] Date of Birth: [birth year] Referring Provider (PT): Joyce Lynn, [place]   Encounter Date: 04/15/2019  PT End of Session - 04/15/19 7353    Visit Number  2    Date for PT Re-Evaluation  05/29/19    Authorization Type  Medicaid - submitting for auth    Authorization Time Period  04/15/19-05/05/19    Authorization - Visit Number  1    Authorization - Number of Visits  3    PT Start Time  0929    PT Stop Time  1020    PT Time Calculation (min)  51 min    Activity Tolerance  Patient tolerated treatment well    Behavior During Therapy  [facility] for tasks assessed/performed       Past Medical History:  Diagnosis Date  . Allergy   . GERD (gastroesophageal reflux disease)   . Hyperlipidemia   . Hypertension   . Panic attacks     Past Surgical History:  Procedure Laterality Date  . ABDOMINAL HYSTERECTOMY    . VASCULAR SURGERY  09/2015   stint placement     There were no vitals filed for this visit.  Subjective Assessment - 04/15/19 0931    Subjective  Pt states she has been focusing on diaphragmatic breathing in SL and it is really helping.  Hasn't tried [employer] yet for pelvic yoga.    Pertinent History  Past Surgical Hx: partial hysterectomy    Limitations  Sitting    How long can you sit comfortably?  1 hour    Patient Stated Goals  get rid of pain, be able to achieve positioning for sexual activity that is more comfortable, sitting to watch TV without pain, vaginal intercourse without pain    Currently in Pain?  No/denies                       [facility] Adult PT Treatment/Exercise - 04/15/19 0001      Self-Care   Self-Care  Other Self-Care Comments    Other Self-Care Comments   use of and timing of HEP for  flexibility, ROM; intro dilator use for gradual vaginal penetration, use of imagery, and PF relaxation      Exercises   Exercises  Lumbar;Knee/Hip      Lumbar Exercises: Stretches   Lobbyist  Left;Right;1 rep;20 seconds    Piriformis Stretch  Left;Right    Figure 4 Stretch  1 rep;20 seconds;With overpressure    Figure 4 Stretch Limitations  push knee away    Other Lumbar Stretch Exercise  butterfly adductor stretch with diaphragmatic breathing      Lumbar Exercises: Quadruped   Madcat/Old Horse  10 reps    Other Quadruped Lumbar Exercises  prayer, prayer with SB x 5 each             PT Education - 04/15/19 1021    Education Details  intro do dilator use + Access Code: [email or web address] (stretching and spine ROM added), timing of stretch routine after walks    Person(s) Educated  Patient    Methods  Explanation;Demonstration;Verbal cues;Handout    Comprehension  Verbalized understanding;Returned demonstration          PT Long Term Goals - 04/15/19 1028      PT  LONG TERM GOAL #1   Title  Pt will be ind in HEP for pelvic release, core stabilization with breath control, and LE ROM/stretching to improve pain and mobility.    Baseline  working on learning diaphragmatic breathing for pelvic floor release and abdominal pressure control, progressing HEP for spine ROM and LE stretching    Status  On-going      PT LONG TERM GOAL #2   Title  Pt will achieve LE flexibility to WNL to improve hip mobility and reduce undue strain on pelvic region.    Status  On-going      PT LONG TERM GOAL #3   Title  Pt will be able to sit x 1 hour with pain not to exceed 3/10 in pelvic region to improve tolerance of computer work.    Status  On-going      PT LONG TERM GOAL #4   Title  Pt will be able to walk x 3 miles at least 3 days per week without exacerbation of groin pain.    Status  On-going      PT LONG TERM GOAL #5   Title  Pt will be able to use level 7 or 8 (second largest or largest)  dilator without pain to prepare for vaginal penetration.    Status  On-going            Plan - 04/15/19 1022    Clinical Impression Statement  Pt arrived without pain and improved pain since initial evaluation with initial HEP.  She has signif difficulty with diaphragmatic breathing and draws in abdominal wall on inhale and actively exhales with abdominals vs being passive.  PT performed TC and VC with manual pressure on abdominal wall in sidelying "breathe into my hand" and cued passive exhale.  Pt was also overbreathing with too fast a pace so PT cued slow down.  PT progressed HEP for LE stretching and spinal ROM with focus on PF stretching and use of continued imagery.  PT encouraged Pt stetch following exercise walks to reduce stiffness following these.  PT introduced dilator use for progression of comfort and relaxation with vaginal penetration due to Pt's recent history of pain following attempts with this.  PT will demo self-stretching next visit if Pt is able to achieve comfort with dilator insertion between now and next visit.    Comorbidities  on disability    Rehab Potential  Excellent    PT Frequency  1x / week    PT Duration  8 weeks    PT Treatment/Interventions  ADLs/Self Care Home Management;Biofeedback;Cryotherapy;Electrical Stimulation;Moist Heat;Patient/family education;Neuromuscular re-education;Therapeutic exercise;Therapeutic activities;Functional mobility training;Stair training;Manual techniques;Passive range of motion;Dry needling;Joint Manipulations;Spinal Manipulations    PT Next Visit Plan  REVIEW GOALS for Medicaid, f/u on HEP, continue diaph breathing in SL for proper sequencing, f/u on dilator use, introitus and pelvic floor manual stretching, lumbar T/L mobs    PT Home Exercise Plan  Access Code: [email or web address]    Consulted and Agree with Plan of Care  Patient       Patient will benefit from skilled therapeutic intervention in order to improve the following deficits  and impairments:     Visit Diagnosis: Other lack of coordination  Cramp and spasm     Problem List There are no problems to display for this patient.   Joyce Lynn, PT 04/15/19 10:33 AM   [facility] [address] [place], [place], [address] Phone: [phone]   Fax:  [phone]  Name: Joyce Lynn MRN: [id number] Date of Birth: 03-29-1955

## 2019-04-15 NOTE — Patient Instructions (Signed)
PROTOCOL FOR VAGINAL DILATORS   1. Wash dilator with soap and water prior to insertion.    2. Lay on your back reclined. Knees are to be up and apart while on your bed or in the bathtub with warm water.   3. Lubricate the end of the dilator with a water-soluble lubricant.  4. Separate the labia.   5. Tense the pelvic floor muscles than relax; while relaxing, slide lubricated dilator( round side of dilator) into the vagina.  Insert toward the direction of your spine. Dilator should feel snug and no pain more than 3/10.   6. Tense muscles again while holding the dilator so it does not get pushed out; relax and slide it in a little further.   7. Try blowing out as if filling a balloon; this may relax the muscles and allow penetration.  Repeat blowing out to insert dilator further.  8. Keep dilator in for 10 minutes if tolerate, with the pelvic floor muscles relaxed to further stretch the canal.   9. Never force the dilator into the canal. 10. Once the dilator is comfortable start to move in and out, side to side, move your hips in different directions  11. 3-4 times per week 12. Progression of dilator.  a. When you are able to place dilator into vaginal canal and feel no pain or able to move without difficulty you are ready for the next size.  b. Before you go to the next size start with the original size for 2 minutes then use the next size up for 5 minutes. c. When the next size up is easy to use, do not have to start with the smaller size.   

## 2019-04-22 ENCOUNTER — Ambulatory Visit: Payer: Medicaid Other | Attending: Physician Assistant | Admitting: Physical Therapy

## 2019-04-22 ENCOUNTER — Encounter: Payer: Self-pay | Admitting: Physical Therapy

## 2019-04-22 ENCOUNTER — Other Ambulatory Visit: Payer: Self-pay

## 2019-04-22 DIAGNOSIS — R252 Cramp and spasm: Secondary | ICD-10-CM

## 2019-04-22 DIAGNOSIS — R278 Other lack of coordination: Secondary | ICD-10-CM | POA: Insufficient documentation

## 2019-04-22 NOTE — Patient Instructions (Signed)
Trial plastic applicator tampon insertion with lubrication, buy multi-size pack and work up to largest diameter.  When comfortable with this, consider purchasing the [employer] on [employer] (about $20).  Focus on breathing, relaxing, opening with insertion.  If insertion is comfortable, you may move the tampon or dilator in/out slowly to desensitize.  Discard tampon after single use.  Use coconut oil or Julva as a nightly before bedtime moisturizer for vaginal health and hydration.    Moisturizers . They are used in the vagina to hydrate the mucous membrane that make up the vaginal canal. . Designed to keep a more normal acid balance (ph) . Once placed in the vagina, it will last between two to three days.  . Use 2-3 times per week at bedtime  . Ingredients to avoid is glycerin and fragrance, can increase chance of infection . Should not be used just before sex due to causing irritation . Most are gels administered either in a tampon-shaped applicator or as a vaginal suppository. They are non-hormonal.   Types of Moisturizers  . Vitamin E vaginal suppositories- Whole foods, Amazon . Moist Again . Coconut oil- can break down condoms . Julva- (Do no use if on Tamoxifen) amazon . Yes moisturizer- amazon . NeuEve Silk , NeuEve Silver for menopausal or over 65 (if have severe vaginal atrophy or cancer treatments use NeuEve Silk for  1 month than move to [employer])- [employer], ShapeConsultant.com.cy . Olive and Bee intimate cream- www.oliveandbee.com.au . Mae vaginal moisturizer- Amazon . Aloe .    Creams to use externally on the Vulva area  Marathon Oil (good for for cancer patients that had radiation to the area)- [place] or [employer].https://garcia-valdez.org/  V-magic cream - amazon  Julva-amazon  Vital "V Wild Yam salve ( help moisturize and help with thinning vulvar area, does have Beeswax  MoodMaid Botanical Pro-Meno Wild Yam Cream- Amazon  Desert Harvest Gele  Cleo by Zane Herald labial  moisturizer (Amazon,   Coconut or olive oil  aloe   Things to avoid in the vaginal area . Do not use things to irritate the vulvar area . No lotions just specialized creams for the vulva area- Neogyn, V-magic, No soaps; can use Aveeno or Calendula cleanser if needed. Must be gentle . No deodorants . No douches . Good to sleep without underwear to let the vaginal area to air out . No scrubbing: spread the lips to let warm water rinse over labias and pat dry

## 2019-04-22 NOTE — Therapy (Addendum)
[facility] [address] [place], [place], [address] Phone: [phone]   Fax:  [phone]  Physical Therapy Treatment  Patient Details  Name: Joyce Lynn MRN: [id number] Date of Birth: 1955-11-12 Referring Provider (PT): Harrison Mons, [place]   Encounter Date: 04/22/2019  PT End of Session - 04/22/19 0840    Visit Number  3    Date for PT Re-Evaluation  05/29/19    Authorization Type  medicaid - submitting for renewal    Authorization Time Period  04/15/19-05/05/19    Authorization - Visit Number  2    Authorization - Number of Visits  3    PT Start Time  0840    PT Stop Time  0930    PT Time Calculation (min)  50 min    Activity Tolerance  Patient tolerated treatment well    Behavior During Therapy  [facility] for tasks assessed/performed       Past Medical History:  Diagnosis Date  . Allergy   . GERD (gastroesophageal reflux disease)   . Hyperlipidemia   . Hypertension   . Panic attacks     Past Surgical History:  Procedure Laterality Date  . ABDOMINAL HYSTERECTOMY    . VASCULAR SURGERY  09/2015   stint placement     There were no vitals filed for this visit.  Subjective Assessment - 04/22/19 0840    Subjective  I may have overstretched with my HEP doing the pelvic yoga.  I attempted some self-vaginal stretching with my finger and ended up getting some light spotting.  Otherwise no soreness from doing that.  I used the [employer] and it irritated my skin.  Overall I feel like I'm improving.  No more pain in sitting while doing computer or TV time but I can still get pain after my 3 mile walks but it is less intense.  Lasts anywhere from 2-6 hours after the walk but the stretching helps.    Pertinent History  Past Surgical Hx: partial hysterectomy    Limitations  Sitting    How long can you sit comfortably?  1 hour    How long can you walk comfortably?  3 miles    Patient Stated Goals  get rid of  pain, be able to achieve positioning for sexual activity that is more comfortable, sitting to watch TV without pain, vaginal intercourse without pain    Currently in Pain?  No/denies                       [facility] Adult PT Treatment/Exercise - 04/22/19 0001      Self-Care   Self-Care  Other Self-Care Comments    Other Self-Care Comments   info on and use of vaginal moisturizers, introitus self-stretching      Exercises   Exercises  Lumbar;Knee/Hip      Lumbar Exercises: Aerobic   Recumbent Bike  L3 x 8', PT present to review progress and goals      Knee/Hip Exercises: Stretches   Active Hamstring Stretch  Both;1 rep;20 seconds    Active Hamstring Stretch Limitations  foot on second step    Hip Flexor Stretch  Both;1 rep;20 seconds    Hip Flexor Stretch Limitations  foot on 2nd step      Manual Therapy   Manual Therapy  Soft tissue mobilization    Manual therapy comments  tighter on Rt>Lt    Soft tissue mobilization  vaginal introitus  sweeping and single knuckle depth stretching at 4:00, 5:00, 6:00, 8:00 x 1 min each                  PT Long Term Goals - 04/22/19 0848      PT LONG TERM GOAL #1   Title  Pt will be ind in HEP for pelvic release, core stabilization with breath control, and LE ROM/stretching to improve pain and mobility.    Baseline  working on breathing with diaphragm, has initiated LE stretching and spine ROM, progressing core stabilization and coordination with breathing    Status  On-going      PT LONG TERM GOAL #2   Title  Pt will achieve LE flexibility to WNL to improve hip mobility and reduce undue strain on pelvic region.    Baseline  hip flexibility limited by 20% (adductors, piriformis, gluteals, hip flexors), bil hip ER limited 30%    Status  On-going      PT LONG TERM GOAL #3   Title  Pt will be able to sit x 1 hour with pain not to exceed 3/10 in pelvic region to improve tolerance of computer work.    Baseline  resolved     Status  Achieved      PT LONG TERM GOAL #4   Title  Pt will be able to walk x 3 miles at least 3 days per week without exacerbation of groin pain.    Baseline  pain reaches 4-5/10 after walks with use of cool down and stretches    Status  On-going      PT LONG TERM GOAL #5   Title  Pt will be able to use level 7 or 8 (second largest or largest) dilator without pain to prepare for vaginal penetration.    Baseline  Can tolerate single finger only during internal pelvic exam, working on self-stretching tolerance and technique    Status  On-going      Additional Long Term Goals   Additional Long Term Goals  Yes      PT LONG TERM GOAL #6   Title  Pt will understand benefit of and perform consistent use of vaginal moisturizer use for tissue health and hydration.    Baseline  no knowledge    Time  6    Period  Weeks    Status  New    Target Date  06/03/19            Plan - 04/22/19 1030    Clinical Impression Statement  Pt is making progress towards improved self-management of symptoms.  She is compliant with initial HEP for pelvic yoga, LE stretching and spine ROM.  She is using LE and spine ROM stretching to cool down after 3 mile walks which has brought post-walk pain down to 4-5/10 vs 8/10.  PT assessed pelvic tension with biofeedback today and resting tone was <5 microvolts suggesting no abnormal tension of pelvic floor at rest.  PT finds significant tightness of introitus and Rt>Lt pelvic floor and initiated manual stretching today.  Pt trialed self-penetration for stretching at home with finger at home and did not feel comfortable using her own finger so PT gave instructions on trialing use of a plastic tampon applicator, progressing in size as able.  PT explained the need to progress slowly and focus on relaxation and initiating of moving in/out to desensitize vaginal canal tissues if comfortable.  PT discussed use of tampon or dilator for self-stretching towards goal of  vaginal  penetration without pain.  PT also educated Pt on vaginal tissue fragility and dryness and how to manage with moisturizers.  Pt will continue to benefit from Pt education, manual therapy, and ther ex with focus on improved mobility of tissues and muslces for reduced pain and function toward goals.    Personal Factors and Comorbidities  Profession    Comorbidities  on disability    Stability/Clinical Decision Making  Stable/Uncomplicated    Clinical Decision Making  Low    Rehab Potential  Excellent    PT Frequency  2x / week    PT Duration  8 weeks    PT Treatment/Interventions  ADLs/Self Care Home Management;Biofeedback;Cryotherapy;Electrical Stimulation;Moist Heat;Patient/family education;Neuromuscular re-education;Therapeutic exercise;Therapeutic activities;Functional mobility training;Stair training;Manual techniques;Passive range of motion;Dry needling;Joint Manipulations;Spinal Manipulations    PT Next Visit Plan  f/u on self-stretching with tampon, vag moisturizer use, seated on ball on towel ROM, DKTC, manual therapy for pelvic floor stretching, DN adductors?    PT Home Exercise Plan  Access Code: [email or web address]    Consulted and Agree with Plan of Care  Patient       Patient will benefit from skilled therapeutic intervention in order to improve the following deficits and impairments:  Hypomobility, Decreased range of motion, Postural dysfunction, Increased fascial restricitons, Increased muscle spasms, Impaired flexibility, Pain, Impaired tone  Visit Diagnosis: Other lack of coordination - Plan: PT plan of care cert/re-cert  Cramp and spasm - Plan: PT plan of care cert/re-cert     Problem List There are no problems to display for this patient.   PHYSICAL THERAPY DISCHARGE SUMMARY  Visits from Start of Care: 3  Current functional level related to goals / functional outcomes: See above   Remaining deficits: See above   Education / Equipment: HEP  Plan: Patient agrees to  discharge.  Patient goals were partially met. Patient is being discharged due to being pleased with the current functional level.  ?????          Baruch Merl, PT 04/25/19 1:32 PM   [facility] [address] [place], [place], [address] Phone: [phone]   Fax:  [phone]  Name: Joyce Lynn MRN: [id number] Date of Birth: 1955/05/11

## 2019-05-08 ENCOUNTER — Encounter: Payer: Medicaid Other | Admitting: Physical Therapy

## 2019-05-15 ENCOUNTER — Encounter: Payer: Medicaid Other | Admitting: Physical Therapy

## 2019-05-17 ENCOUNTER — Encounter: Payer: Medicaid Other | Admitting: Physical Therapy

## 2019-05-22 ENCOUNTER — Encounter: Payer: Medicaid Other | Admitting: Physical Therapy

## 2019-05-27 ENCOUNTER — Encounter: Payer: Medicaid Other | Admitting: Physical Therapy

## 2019-06-05 ENCOUNTER — Encounter: Payer: Medicaid Other | Admitting: Physical Therapy

## 2019-07-29 ENCOUNTER — Other Ambulatory Visit: Payer: Self-pay | Admitting: Physician Assistant

## 2019-07-29 DIAGNOSIS — Z1231 Encounter for screening mammogram for malignant neoplasm of breast: Secondary | ICD-10-CM

## 2019-08-26 ENCOUNTER — Emergency Department (HOSPITAL_COMMUNITY): Payer: Medicaid Other

## 2019-08-26 ENCOUNTER — Encounter (HOSPITAL_COMMUNITY): Payer: Self-pay | Admitting: *Deleted

## 2019-08-26 ENCOUNTER — Emergency Department (HOSPITAL_COMMUNITY)
Admission: EM | Admit: 2019-08-26 | Discharge: 2019-08-26 | Disposition: A | Discharge status: Home / Self Care | Payer: Medicaid Other | Attending: Emergency Medicine | Admitting: Emergency Medicine

## 2019-08-26 ENCOUNTER — Other Ambulatory Visit: Payer: Self-pay

## 2019-08-26 DIAGNOSIS — Z79899 Other long term (current) drug therapy: Secondary | ICD-10-CM | POA: Insufficient documentation

## 2019-08-26 DIAGNOSIS — I10 Essential (primary) hypertension: Secondary | ICD-10-CM | POA: Diagnosis not present

## 2019-08-26 DIAGNOSIS — R002 Palpitations: Secondary | ICD-10-CM | POA: Diagnosis not present

## 2019-08-26 DIAGNOSIS — I493 Ventricular premature depolarization: Secondary | ICD-10-CM

## 2019-08-26 DIAGNOSIS — E876 Hypokalemia: Secondary | ICD-10-CM

## 2019-08-26 DIAGNOSIS — F410 Panic disorder [episodic paroxysmal anxiety] without agoraphobia: Secondary | ICD-10-CM

## 2019-08-26 DIAGNOSIS — R0789 Other chest pain: Secondary | ICD-10-CM | POA: Diagnosis present

## 2019-08-26 LAB — CBC
HCT: 40 % (ref 36.0–46.0)
Hemoglobin: 13 g/dL (ref 12.0–15.0)
MCH: 31 pg (ref 26.0–34.0)
MCHC: 32.5 g/dL (ref 30.0–36.0)
MCV: 95.2 fL (ref 80.0–100.0)
Platelets: 257 10*3/uL (ref 150–400)
RBC: 4.2 MIL/uL (ref 3.87–5.11)
RDW: 12.5 % (ref 11.5–15.5)
WBC: 7.8 10*3/uL (ref 4.0–10.5)
nRBC: 0 % (ref 0.0–0.2)

## 2019-08-26 LAB — TROPONIN I (HIGH SENSITIVITY)
Troponin I (High Sensitivity): 5 ng/L (ref <18)
Troponin I (High Sensitivity): 5 ng/L (ref <18)

## 2019-08-26 LAB — BASIC METABOLIC PANEL
Anion gap: 10 (ref 5–15)
BUN: 11 mg/dL (ref 8–23)
CO2: 26 mmol/L (ref 22–32)
Calcium: 9.5 mg/dL (ref 8.9–10.3)
Chloride: 105 mmol/L (ref 98–111)
Creatinine, Ser: 0.96 mg/dL (ref 0.44–1.00)
GFR calc Af Amer: >60 mL/min (ref >60)
GFR calc non Af Amer: >60 mL/min (ref >60)
Glucose, Bld: 136 mg/dL — ABNORMAL HIGH (ref 70–99)
Potassium: 3 mmol/L — ABNORMAL LOW (ref 3.5–5.1)
Sodium: 141 mmol/L (ref 135–145)

## 2019-08-26 MED ORDER — ALUM & MAG HYDROXIDE-SIMETH 200-200-20 MG/5ML PO SUSP
30.0000 mL | Freq: Once | ORAL | Status: AC
  Administered 2019-08-26: 30 mL via ORAL
  Filled 2019-08-26: qty 30

## 2019-08-26 MED ORDER — SODIUM CHLORIDE 0.9% FLUSH: 3.0000 mL | Freq: Once | INTRAVENOUS | Status: DC

## 2019-08-26 MED ORDER — POTASSIUM CHLORIDE CRYS ER 20 MEQ PO TBCR
40.0000 meq | EXTENDED_RELEASE_TABLET | Freq: Once | ORAL | Status: AC
  Administered 2019-08-26: 40 meq via ORAL
  Filled 2019-08-26: qty 2

## 2019-08-26 NOTE — Discharge Instructions (Addendum)
Your low potassium is likely due to your hydrochlorothiazide. Please increase your potassium to 20 mEq twice per day for 7 days. Follow up with your PCP in 1 week to recheck your potassium and to discuss potassium supplement dosing.

## 2019-08-26 NOTE — ED Provider Notes (Signed)
[facility] EMERGENCY DEPARTMENT Provider Note  CSN: [id number] Arrival date & time: 08/26/19 0216  Chief Complaint(s) Chest Pain  HPI Joyce Lynn is a 64 y.o. female   CC: Chest discomfort  Onset/Duration: 3 weeks Timing: Intermittent Location: Anterior/left Quality: Described as flutters Severity: Initially mild but today's was more severe Modifying Factors:  Improved by: Xanax  Worsened by: Nothing Associated Signs/Symptoms:  Pertinent (+): Shortness of breath, lightheadedness, bilateral upper and lower extremity numbness and tingling, nausea  Pertinent (-): Fevers, chills, cough, congestion, vomiting, abdominal pain.  Context: Patient reports that she has been having these flutters more frequently.  She feels her anxiety build up when she feels the flutters causing her to go into panic attacks.  She has been taking an extra dose of her Xanax in the last several days due to these episodes.   HPI  Past Medical History Past Medical History:  Diagnosis Date  . Allergy   . GERD (gastroesophageal reflux disease)   . Hyperlipidemia   . Hypertension   . Panic attacks    There are no problems to display for this patient.  Home Medication(s) Prior to Admission medications   Medication Sig Start Date End Date Taking? Authorizing Provider  ALPRAZolam Prudy Feeler) 0.5 MG tablet Take 1 tablet (0.5 mg total) by mouth at bedtime as needed for anxiety. 05/02/15   Eber Hong, MD  amLODipine (NORVASC) 10 MG tablet Take 10 mg by mouth daily.    [provider]  atorvastatin (LIPITOR) 40 MG tablet Take 40 mg by mouth daily.    [provider]  escitalopram (LEXAPRO) 10 MG tablet Take 10 mg by mouth daily.    [provider]  lisinopril (PRINIVIL,ZESTRIL) 5 MG tablet Take 5 mg by mouth daily.    [provider]  pantoprazole (PROTONIX) 40 MG tablet Take 40 mg by mouth daily.    [provider]  potassium chloride (K-DUR) 10  MEQ tablet Take 1 tablet (10 mEq total) by mouth 2 (two) times daily. Patient not taking: Reported on 09/17/2014 02/21/14 05/02/15  Vanetta Mulders, MD                                                                                                                                    Past Surgical History Past Surgical History:  Procedure Laterality Date  . ABDOMINAL HYSTERECTOMY    . VASCULAR SURGERY  09/2015   stint placement    Family History No family history on file.  Social History Social History   Tobacco Use  . Smoking status: Never Smoker  . Smokeless tobacco: Never Used  Substance Use Topics  . Alcohol use: Yes    Comment: occ  . Drug use: No   Allergies Penicillins  Review of Systems Review of Systems All other systems are reviewed and are negative for acute change except as noted in the HPI  Physical Exam Vital  Signs  I have reviewed the triage vital signs BP (!) 147/71 (BP Location: Right Arm)   Pulse 87   Temp 98.3 F (36.8 C) (Oral)   Resp 17   Ht 5\' 4"  (1.626 m)   Wt 80.3 kg   SpO2 99%   BMI 30.38 kg/m   Physical Exam Vitals reviewed.  Constitutional:      General: She is not in acute distress.    Appearance: She is well-developed. She is not diaphoretic.  HENT:     Head: Normocephalic and atraumatic.     Nose: Nose normal.  Eyes:     General: No scleral icterus.       Right eye: No discharge.        Left eye: No discharge.     Conjunctiva/sclera: Conjunctivae normal.     Pupils: Pupils are equal, round, and reactive to light.  Cardiovascular:     Rate and Rhythm: Normal rate and regular rhythm.     Heart sounds: No murmur heard.  No friction rub. No gallop.   Pulmonary:     Effort: Pulmonary effort is normal. No respiratory distress.     Breath sounds: Normal breath sounds. No stridor. No rales.  Abdominal:     General: There is no distension.     Palpations: Abdomen is soft.     Tenderness: There is no abdominal tenderness.    Musculoskeletal:        General: No tenderness.     Cervical back: Normal range of motion and neck supple.  Skin:    General: Skin is warm and dry.     Findings: No erythema or rash.  Neurological:     Mental Status: She is alert and oriented to person, place, and time.     ED Results and Treatments Labs (all labs ordered are listed, but only abnormal results are displayed) Labs Reviewed  BASIC METABOLIC PANEL - Abnormal; Notable for the following components:      Result Value   Potassium 3.0 (*)    Glucose, Bld 136 (*)    All other components within normal limits  CBC  TROPONIN I (HIGH SENSITIVITY)  TROPONIN I (HIGH SENSITIVITY)                                                                                                                         EKG  EKG Interpretation  Date/Time:  Monday August 26 2019 02:25:40 EDT Ventricular Rate:  115 PR Interval:  154 QRS Duration: 80 QT Interval:  328 QTC Calculation: 453 R Axis:   46 Text Interpretation: Sinus tachycardia Low voltage QRS Reconfirmed by Drema Pryardama, Devyn Sheerin [phone]([id number]) on 08/26/2019 5:26:01 AM      Radiology DG Chest 2 View  Result Date: 08/26/2019 CLINICAL DATA:  Chest pain and shortness of breath EXAM: CHEST - 2 VIEW COMPARISON:  05/01/2015 FINDINGS: Cardiac shadow is stable. Aortic calcifications are again seen. The lungs are well aerated without focal infiltrate or sizable effusion.  No bony abnormality is seen. IMPRESSION: No active cardiopulmonary disease. Electronically Signed   By: Alcide Clever M.D.   On: 08/26/2019 02:48    Pertinent labs & imaging results that were available during my care of the patient were reviewed by me and considered in my medical decision making (see chart for details).  Medications Ordered in ED Medications  sodium chloride flush (NS) 0.9 % injection 3 mL (has no administration in time range)  alum & mag hydroxide-simeth (MAALOX/MYLANTA) 200-200-20 MG/5ML suspension 30 mL (30 mLs Oral Given  08/26/19 0701)  potassium chloride SA (KLOR-CON) CR tablet 40 mEq (40 mEq Oral Given 08/26/19 0700)                                                                                                                                    Procedures .1-3 Lead EKG Interpretation Performed by: Nira Conn, MD Authorized by: Nira Conn, MD     Interpretation: normal     ECG rate:  85   ECG rate assessment: normal     Rhythm: sinus rhythm     Ectopy: PVCs     Conduction: normal      (including critical care time)  Medical Decision Making / ED Course I have reviewed the nursing notes for this encounter and the patient's prior records (if available in EHR or on provided paperwork).   Joyce Lynn was evaluated in Emergency Department on 08/26/2019 for the symptoms described in the history of present illness. She was evaluated in the context of the global COVID-19 pandemic, which necessitated consideration that the patient might be at risk for infection with the SARS-CoV-2 virus that causes COVID-19. Institutional protocols and algorithms that pertain to the evaluation of patients at risk for COVID-19 are in a state of rapid change based on information released by regulatory bodies including the CDC and federal and state organizations. These policies and algorithms were followed during the patient's care in the ED.  Chest discomfort described as flutters. No overt pain.  EKG without acute ischemic changes or evidence of pericarditis. Troponins x2 -. Highly inconsistent with ACS.  Low suspicion for pulmonary embolism.  Presentation not classic for aortic dissection or esophageal perforation. Chest x-ray without evidence suggestive of pneumonia, pneumothorax, pneumomediastinum.  No abnormal contour of the mediastinum to suggest dissection. No evidence of acute injuries.  Labs were notable for hypokalemia likely contributing to the patient's PVCs and flutters which appeared to be  causing her to go into a panic attack.  On review of records, patient is on lisinopril/HCTZ.  This is likely the cause of her hypokalemia.  She is currently on potassium supplements at 20 mEq daily.  We will increase her dose for 1 week and have her follow-up with her primary care provider.      Final Clinical Impression(s) / ED Diagnoses Final diagnoses:  Hypokalemia  Palpitations  PVC (premature ventricular contraction)  Panic attack   The patient appears reasonably screened and/or stabilized for discharge and I doubt any other medical condition or other [facility] requiring further screening, evaluation, or treatment in the ED at this time prior to discharge. Safe for discharge with strict return precautions.  Disposition: Discharge  Condition: Good  I have discussed the results, Dx and Tx plan with the patient/family who expressed understanding and agree(s) with the plan. Discharge instructions discussed at length. The patient/family was given strict return precautions who verbalized understanding of the instructions. No further questions at time of discharge.    ED Discharge Orders    None       Follow Up: Primary care provider  Schedule an appointment as soon as possible for a visit in 1 week to repeat potassium level      This chart was dictated using voice recognition software.  Despite best efforts to proofread,  errors can occur which can change the documentation meaning.   Nira Conn, MD 08/26/19 [phone]

## 2019-08-26 NOTE — ED Notes (Signed)
Pt. Refused last set of vitals.

## 2019-08-26 NOTE — ED Triage Notes (Signed)
The pt is c/o chest pains for 3one week  Worse tonight with some sob.  She has anxiety and she took a xanax before she came here tonight

## 2019-09-02 ENCOUNTER — Other Ambulatory Visit: Payer: Self-pay

## 2019-09-02 ENCOUNTER — Ambulatory Visit
Admission: RE | Admit: 2019-09-02 | Discharge: 2019-09-02 | Disposition: A | Discharge status: Home / Self Care | Payer: Medicaid Other | Source: Ambulatory Visit | Attending: Physician Assistant | Admitting: Physician Assistant

## 2019-09-02 DIAGNOSIS — Z1231 Encounter for screening mammogram for malignant neoplasm of breast: Secondary | ICD-10-CM

## 2020-07-28 ENCOUNTER — Other Ambulatory Visit: Payer: Self-pay | Admitting: Physician Assistant

## 2020-07-28 DIAGNOSIS — Z1231 Encounter for screening mammogram for malignant neoplasm of breast: Secondary | ICD-10-CM

## 2020-09-22 ENCOUNTER — Ambulatory Visit
Admission: RE | Admit: 2020-09-22 | Discharge: 2020-09-22 | Disposition: A | Discharge status: Home / Self Care | Payer: Medicaid Other | Source: Ambulatory Visit | Attending: Physician Assistant | Admitting: Physician Assistant

## 2020-09-22 ENCOUNTER — Other Ambulatory Visit: Payer: Self-pay

## 2020-09-22 DIAGNOSIS — Z1231 Encounter for screening mammogram for malignant neoplasm of breast: Secondary | ICD-10-CM

## 2021-06-14 ENCOUNTER — Telehealth: Payer: Self-pay

## 2021-06-14 NOTE — Telephone Encounter (Signed)
Patient had referral in for a sleep consultation but it appears she is already established with [facility]. Called to clarify this with pt and pt declined to be seen in our office as she is already established with a sleep provider. Fax sent to referring office to make them aware of decision.  ?

## 2021-08-25 ENCOUNTER — Other Ambulatory Visit: Payer: Self-pay | Admitting: *Deleted

## 2021-08-25 DIAGNOSIS — Z1231 Encounter for screening mammogram for malignant neoplasm of breast: Secondary | ICD-10-CM

## 2021-09-27 ENCOUNTER — Ambulatory Visit
Admission: RE | Admit: 2021-09-27 | Discharge: 2021-09-27 | Disposition: A | Discharge status: Home / Self Care | Payer: Medicare Other | Source: Ambulatory Visit | Attending: *Deleted | Admitting: *Deleted

## 2021-09-27 DIAGNOSIS — Z1231 Encounter for screening mammogram for malignant neoplasm of breast: Secondary | ICD-10-CM

## 2021-12-02 ENCOUNTER — Encounter: Payer: Self-pay | Admitting: Allergy

## 2021-12-02 ENCOUNTER — Ambulatory Visit (INDEPENDENT_AMBULATORY_CARE_PROVIDER_SITE_OTHER): Payer: Medicare Other | Admitting: Allergy

## 2021-12-02 VITALS — BP 130/80 | HR 80 | Temp 98.3°F | Resp 20 | Ht 64.33 in | Wt 176.0 lb

## 2021-12-02 DIAGNOSIS — J4540 Moderate persistent asthma, uncomplicated: Secondary | ICD-10-CM | POA: Diagnosis not present

## 2021-12-02 DIAGNOSIS — J310 Chronic rhinitis: Secondary | ICD-10-CM | POA: Diagnosis not present

## 2021-12-02 MED ORDER — CARBINOXAMINE MALEATE 4 MG PO TABS: 8.0000 mg | ORAL_TABLET | Freq: Two times a day (BID) | ORAL | 5 refills | Status: AC | PRN

## 2021-12-02 MED ORDER — IPRATROPIUM BROMIDE 0.06 % NA SOLN: 2.0000 | Freq: Three times a day (TID) | NASAL | 5 refills | Status: AC | PRN

## 2021-12-02 MED ORDER — LEVALBUTEROL TARTRATE 45 MCG/ACT IN AERO: 2.0000 | INHALATION_SPRAY | RESPIRATORY_TRACT | 1 refills | Status: AC | PRN

## 2021-12-02 NOTE — Addendum Note (Signed)
Addended by: Eloy End D on: 12/02/2021 11:08 AM   Modules accepted: Orders

## 2021-12-02 NOTE — Patient Instructions (Addendum)
-  Return on 12/09/21 at 8:30 for environmental allergy skin testing visit.  Do not take any antihistamines for 3 days prior to this testing date - Continue with: Singulair (montelukast) 1/2 tab daily for best control - Start taking: Atrovent (ipratropium) 0.06% one spray per nostril 2-3 times daily as needed for runny nose/throat clearing. Carbinoxamine 8mg  1-2 times a day as needed for allergy symptom control.  This is an prescription based antihistamine medication.  - Consider nasal saline rinses to remove allergens from the nasal cavities as well as help with mucous clearance (this is especially helpful to do before the nasal sprays are given).  Kit provided.   Use saline rinse prior to nasal spray use.  Use distilled water or boil water and bring to room temperature (do not use tap water).  Keep mouth open during entire process and breathe through mouth.   - Lung function testing is normal today - Spacer use reviewed. - Daily controller medication(s): Singulair as above - Rescue medications: Xopenex 2 puffs every 4-6 hours as needed for cough, wheeze, shortness of breath, chest tightness.   Let us know if you tolerate this reliever inhaler  - Asthma control goals:  * Full participation in all desired activities (may need albuterol before activity) * Albuterol use two time or less a week on average (not counting use with activity) * Cough interfering with sleep two time or less a month * Oral steroids no more than once a year * No hospitalizations  Follow-up next week for skin testing

## 2021-12-02 NOTE — Progress Notes (Signed)
New Patient Note  RE: Joyce Lynn MRN: [id number] DOB: 1956-02-11 Date of Office Visit: 12/02/2021   Primary care provider: Harrison Mons, PA  Chief Complaint: Allergies and asthma  History of present illness: Joyce Lynn is a 66 y.o. female presenting today for evaluation of allergies and asthma.  She reports she is always feeling mucus in her throat and is trying to bring it out and sometimes at night it can choke her up.  She feels like the mucus is just stuck in the throat.  She also reports runny nose, coughing, headache and sometimes puffiness of the eyes.  The headache she reports a pressure in the forehead and sometimes in the back of head.  She is following with a neurologist and has an abortive medication at this time.  She was on an injection for headaches for prevention but was expensive even with insurance thus she is not on this any longer but it was effective.  She does not report history of recurrent sinus infection.  She has tried all the OTC antihistamine without much benefit.  She has used nasacort that has not been helpful for nasal symptoms.  She also has tried astelin nasal spray that wasn't effective either.  She has history of reflux and is on antireflux medication Nexium at this time.   She states she was having issues with her breathing with shortness of breath and cough.  She states she would have nighttime awakenings.  She states she would occasionally have wheezing as well as chest tightness.  No triggers at this time.  She is following with pulmonology at [facility] with Dr. Vickki Muff for moderate persistent asthma.  She did have normal spirometry and DLCO this summer per report.  She has had a normal CXR from May 2023.  She did undergo a methacholine challenge in July 2023 that was suggestive of asthma.  She was prescribed an albuterol inhaler with a spacer device.  She noted that albuterol made her shortness of breath feel worse.  She states she does did not  have a another inhaler.  However from the pulmonology report it was recommended for a trial of levalbuterol.  She also does not know what the spacer is at this time.  She has been undergoing a trial of Singulair.  She states she is not taking it daily and may take it every other day when she has more symptoms and feels it is more helpful for the headache control. She is taking 1/2 tab of singulair.  She states she does not really like to take medications and when she does start a new medication she typically starts with a half dose to see if she will tolerate it.  She has not been having any issues with the half tablet of Singulair. 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  Review of systems: Review of Systems  Constitutional: Negative.   HENT:  Positive for postnasal drip.   Eyes: Negative.   Respiratory:  Positive for cough and shortness of breath.   Cardiovascular: Negative.   Gastrointestinal: Negative.   Musculoskeletal: Negative.   Skin: Negative.   Allergic/Immunologic: Negative.   Neurological: Negative.     All other systems negative unless noted above in HPI  Past medical history: Past Medical History:  Diagnosis Date   Allergy    Asthma    GERD (gastroesophageal reflux disease)    Hyperlipidemia    Hypertension  Panic attacks    Subclavian steal syndrome     Past surgical history: Past Surgical History:  Procedure Laterality Date   ABDOMINAL HYSTERECTOMY     VASCULAR SURGERY  09/2015   stint placement     Family history:  Family History  Problem Relation Age of Onset   Allergic rhinitis Mother    Allergic rhinitis Sister    Allergic rhinitis Sister    Allergic rhinitis Sister    Asthma Brother    Asthma Brother     Social history: Lives in an apartment with carpeting with electric heating and central cooling.  Dog in the home.  There is no concern for water damage, mildew or  roaches in the home.  She does not report or working at this time.  She denies having history.   Medication List: Current Outpatient Medications  Medication Sig Dispense Refill   ALPRAZolam (XANAX) 0.5 MG tablet Take 1 tablet (0.5 mg total) by mouth at bedtime as needed for anxiety. 5 tablet 0   amLODipine (NORVASC) 10 MG tablet Take 10 mg by mouth daily.     B COMPLEX VITAMINS PO Take 1 tablet by mouth daily.     baclofen (LIORESAL) 10 MG tablet Take by mouth.     busPIRone (BUSPAR) 5 MG tablet Take by mouth.     calcium carbonate (OSCAL) 1500 (600 Ca) MG TABS tablet Take by mouth.     carvedilol (COREG) 6.25 MG tablet Take 18.75 mg by mouth 2 (two) times daily.     Cholecalciferol (VITAMIN D3 PO) Take by mouth.     dorzolamide (TRUSOPT) 2 % ophthalmic solution Place one drop into both eyes 2 (two) times daily.     esomeprazole (NEXIUM) 40 MG capsule Take 1 capsule by mouth daily.     latanoprost (XALATAN) 0.005 % ophthalmic solution Place 1 drop into both eyes nightly.     lisinopril (PRINIVIL,ZESTRIL) 5 MG tablet Take 5 mg by mouth daily.     montelukast (SINGULAIR) 10 MG tablet Take by mouth.     rosuvastatin (CRESTOR) 10 MG tablet Take 10 mg by mouth at bedtime.     atorvastatin (LIPITOR) 40 MG tablet Take 40 mg by mouth daily. (Patient not taking: Reported on 12/02/2021)     Carbinoxamine Maleate 4 MG TABS Take 2 tablets (8 mg total) by mouth 2 (two) times daily as needed (Allergies). 56 tablet 5   escitalopram (LEXAPRO) 10 MG tablet Take 10 mg by mouth daily. (Patient not taking: Reported on 12/02/2021)     ipratropium (ATROVENT) 0.06 % nasal spray Place 2 sprays into both nostrils 3 (three) times daily as needed (Runny nose). 15 mL 5   levalbuterol (XOPENEX HFA) 45 MCG/ACT inhaler Inhale 2 puffs into the lungs every 4 (four) hours as needed for wheezing or shortness of breath (Cough, chest tightness). 1 each 1   pantoprazole (PROTONIX) 40 MG tablet Take 40 mg by mouth daily. (Patient  not taking: Reported on 12/02/2021)     No current facility-administered medications for this visit.    Known medication allergies: Allergies  Allergen Reactions   Penicillins Anaphylaxis   Pravastatin Other (See Comments) and Swelling    Elevated blood pressure Elevated blood pressure Elevated blood pressure    Atorvastatin Other (See Comments)    Headache, head pressure Headache, head pressure Headache, head pressure    Escitalopram Oxalate Other (See Comments)    Exacerbation of anxiety Tachycardia Exacerbation of anxiety    Gabapentin Other (See  Comments)    Patient reported medication too strong. Patient reported medication too strong. Patient reported medication too strong.    Mirtazapine Other (See Comments) and Palpitations    Tachycardia  Tachycardia  Tachycardia     Simvastatin Other (See Comments)    Elevated LFTs Elevated LFTs Elevated LFTs    Tizanidine Other (See Comments)    Patient reported dizzy and faint feeling while taking medication.  Patient reported dizzy and faint feeling while taking medication.  Patient reported dizzy and faint feeling while taking medication.     Pantoprazole Nausea Only   Sertraline Diarrhea     Physical examination: Blood pressure 130/80, pulse 80, temperature 98.3 F (36.8 C), temperature source Temporal, resp. rate 20, height 5' 4.33" (1.634 m), weight 176 lb (79.8 kg), SpO2 99 %.  General: Alert, interactive, in no acute distress. HEENT: PERRLA, TMs pearly gray, turbinates non-edematous without discharge, post-pharynx non erythematous. Neck: Supple without lymphadenopathy. Lungs: Clear to auscultation without wheezing, rhonchi or rales. {no increased work of breathing. CV: Normal S1, S2 without murmurs. Abdomen: Nondistended, nontender. Skin: Warm and dry, without lesions or rashes. Extremities:  No clubbing, cyanosis or edema. Neuro:   Grossly intact.  Diagnositics/Labs:  Spirometry: FEV1: 1.65 L 82%,  FVC: 2.07 L 81%, ratio consistent with nonobstructive pattern  Assessment and plan: Rhinitis, presumed allergic  - Return on 12/09/21 at 8:30 for environmental allergy skin testing visit.  Do not take any antihistamines for 3 days prior to this testing date - Continue with: Singulair (montelukast) 1/2 tab daily for best control - Start taking: Atrovent (ipratropium) 0.06% one spray per nostril 2-3 times daily as needed for runny nose/throat clearing. Carbinoxamine 68m 1-2 times a day as needed for allergy symptom control.  This is an prescription based antihistamine medication.  - Consider nasal saline rinses to remove allergens from the nasal cavities as well as help with mucous clearance (this is especially helpful to do before the nasal sprays are given).  Kit provided.   Use saline rinse prior to nasal spray use.  Use distilled water or boil water and bring to room temperature (do not use tap water).  Keep mouth open during entire process and breathe through mouth.   Moderate persistent asthma - Lung function testing is normal today - Spacer use reviewed. - Daily controller medication(s): Singulair as above - Rescue medications: Xopenex 2 puffs every 4-6 hours as needed for cough, wheeze, shortness of breath, chest tightness.   Let uKoreaknow if you tolerate this reliever inhaler -May benefit from a maintenance a controller inhaler.  We will first see how she does with daily use of Singulair and if she responds and tolerates Xopenex for rescue  - Asthma control goals:  * Full participation in all desired activities (may need albuterol before activity) * Albuterol use two time or less a week on average (not counting use with activity) * Cough interfering with sleep two time or less a month * Oral steroids no more than once a year * No hospitalizations  Follow-up next week for skin testing Follow-up for routine office visit in 3 months  I appreciate the opportunity to take part in Jacque's  care. Please do not hesitate to contact me with questions.  Sincerely,   SPrudy Feeler MD Allergy[facility]ology [facility] [place]

## 2021-12-09 ENCOUNTER — Ambulatory Visit (INDEPENDENT_AMBULATORY_CARE_PROVIDER_SITE_OTHER): Payer: Medicare Other | Admitting: Allergy

## 2021-12-09 ENCOUNTER — Encounter: Payer: Self-pay | Admitting: Allergy

## 2021-12-09 VITALS — BP 138/74 | HR 97 | Temp 99.0°F | Resp 16

## 2021-12-09 DIAGNOSIS — J4540 Moderate persistent asthma, uncomplicated: Secondary | ICD-10-CM

## 2021-12-09 DIAGNOSIS — J3089 Other allergic rhinitis: Secondary | ICD-10-CM

## 2021-12-09 MED ORDER — MONTELUKAST SODIUM 10 MG PO TABS: 10.0000 mg | ORAL_TABLET | Freq: Every day | ORAL | 5 refills | Status: AC

## 2021-12-09 NOTE — Progress Notes (Signed)
Follow-up Note  RE: Joyce Lynn MRN: [id number] DOB: 1955-08-28 Date of Office Visit: 12/09/2021   History of present illness: Joyce Lynn is a 66 y.o. female presenting today for skin testing.  Her initial visit was 12/02/2021 by myself. She has held antihistamines for testing today.  She did state 1 time this week during the nasal rinse that she believes the water was a bit too high.  Otherwise she has been using warm water and she does find it to be effective nonmedicated therapy.  She has been doing the following recommendations from last visit including Singulair, carbinoxamine, Atrovent nasal spray.  Review of systems: Review of Systems  Constitutional: Negative.   HENT:         Nasal soreness  Eyes: Negative.   Respiratory: Negative.    Cardiovascular: Negative.   Gastrointestinal: Negative.   Musculoskeletal: Negative.   Skin: Negative.   Allergic/Immunologic: Negative.   Neurological: Negative.      All other systems negative unless noted above in HPI  Past medical/social/surgical/family history have been reviewed and are unchanged unless specifically indicated below.  No changes  Medication List: Current Outpatient Medications  Medication Sig Dispense Refill   ALPRAZolam (XANAX) 0.5 MG tablet Take 1 tablet (0.5 mg total) by mouth at bedtime as needed for anxiety. 5 tablet 0   amLODipine (NORVASC) 10 MG tablet Take 10 mg by mouth daily.     atorvastatin (LIPITOR) 40 MG tablet Take 40 mg by mouth daily.     B COMPLEX VITAMINS PO Take 1 tablet by mouth daily.     baclofen (LIORESAL) 10 MG tablet Take by mouth.     busPIRone (BUSPAR) 5 MG tablet Take by mouth.     calcium carbonate (OSCAL) 1500 (600 Ca) MG TABS tablet Take by mouth.     Carbinoxamine Maleate 4 MG TABS Take 2 tablets (8 mg total) by mouth 2 (two) times daily as needed (Allergies). 56 tablet 5   carvedilol (COREG) 6.25 MG tablet Take 18.75 mg by mouth 2 (two) times daily.      Cholecalciferol (VITAMIN D3 PO) Take by mouth.     dorzolamide (TRUSOPT) 2 % ophthalmic solution Place one drop into both eyes 2 (two) times daily.     escitalopram (LEXAPRO) 10 MG tablet Take 10 mg by mouth daily.     esomeprazole (NEXIUM) 40 MG capsule Take 1 capsule by mouth daily.     ipratropium (ATROVENT) 0.06 % nasal spray Place 2 sprays into both nostrils 3 (three) times daily as needed (Runny nose). 15 mL 5   latanoprost (XALATAN) 0.005 % ophthalmic solution Place 1 drop into both eyes nightly.     levalbuterol (XOPENEX HFA) 45 MCG/ACT inhaler Inhale 2 puffs into the lungs every 4 (four) hours as needed for wheezing or shortness of breath (Cough, chest tightness). 1 each 1   lisinopril (PRINIVIL,ZESTRIL) 5 MG tablet Take 5 mg by mouth daily.     montelukast (SINGULAIR) 10 MG tablet Take by mouth.     pantoprazole (PROTONIX) 40 MG tablet Take 40 mg by mouth daily.     rosuvastatin (CRESTOR) 10 MG tablet Take 10 mg by mouth at bedtime.     No current facility-administered medications for this visit.     Known medication allergies: Allergies  Allergen Reactions   Penicillins Anaphylaxis   Pravastatin Other (See Comments) and Swelling    Elevated blood pressure Elevated blood pressure Elevated blood pressure    Atorvastatin Other (  See Comments)    Headache, head pressure Headache, head pressure Headache, head pressure    Escitalopram Oxalate Other (See Comments)    Exacerbation of anxiety Tachycardia Exacerbation of anxiety    Gabapentin Other (See Comments)    Patient reported medication too strong. Patient reported medication too strong. Patient reported medication too strong.    Mirtazapine Other (See Comments) and Palpitations    Tachycardia  Tachycardia  Tachycardia     Simvastatin Other (See Comments)    Elevated LFTs Elevated LFTs Elevated LFTs    Tizanidine Other (See Comments)    Patient reported dizzy and faint feeling while taking medication.   Patient reported dizzy and faint feeling while taking medication.  Patient reported dizzy and faint feeling while taking medication.     Pantoprazole Nausea Only   Sertraline Diarrhea     Physical examination: Blood pressure 138/74, pulse 97, temperature 99 F (37.2 C), temperature source Temporal, resp. rate 16, SpO2 99 %.  General: Alert, interactive, in no acute distress. HEENT: PERRLA, TMs pearly gray, turbinates minimally edematous without discharge with right inf turbinate with mildly friable spot, post-pharynx non erythematous. Neck: Supple without lymphadenopathy. Lungs: Clear to auscultation without wheezing, rhonchi or rales. {no increased work of breathing. CV: Normal S1, S2 without murmurs. Abdomen: Nondistended, nontender. Skin: Warm and dry, without lesions or rashes. Extremities:  No clubbing, cyanosis or edema. Neuro:   Grossly intact.  Diagnositics/Labs:  Allergy testing:   Airborne Adult Perc - 12/09/21 0836     Time Antigen Placed 4166    Allergen Manufacturer Waynette Buttery    Location Back    Number of Test 59    1. Control-Buffer 50% Glycerol Negative    2. Control-Histamine 1 mg/ml 3+    3. Albumin saline Negative    4. [place]ia 2+    5. [place] 2+    6. Johnson Negative    7. [place] Blue 2+    8. Meadow Fescue 2+    9. Perennial Rye 2+    10. Sweet Vernal Negative    11. Timothy 2+    12. Cocklebur 2+    13. Burweed Marshelder 2+    14. Ragweed, short 2+    15. Ragweed, Giant 2+    16. Plantain,  English Negative    17. Lamb's Quarters Negative    18. Sheep Sorrell Negative    19. Rough Pigweed Negative    20. Marsh Elder, Rough Negative    21. Mugwort, Common 2+    22. Ash mix 2+    23. Birch mix 2+    24. Beech American Negative    25. Box, Elder 2+    26. Cedar, red Negative    27. Cottonwood, [place] Negative    28. Elm mix Negative    29. Hickory Negative    30. Maple mix Negative    31. Oak, [place] mix Negative    32. Pecan Pollen  Negative    33. Pine mix Negative    34. Sycamore Eastern Negative    35. Walnut, Black Pollen Negative    36. Alternaria alternata Negative    37. Cladosporium Herbarum Negative    38. Aspergillus mix Negative    39. Penicillium mix Negative    40. Bipolaris sorokiniana (Helminthosporium) Negative    41. Drechslera spicifera (Curvularia) Negative    42. Mucor plumbeus Negative    43. Fusarium moniliforme Negative    44. Aureobasidium pullulans (pullulara) Negative    45. Rhizopus oryzae  Negative    46. Botrytis cinera Negative    47. Epicoccum nigrum Negative    48. Phoma betae Negative    49. Candida Albicans Negative    50. Trichophyton mentagrophytes Negative    51. Mite, D Farinae  5,000 AU/ml 3+    52. Mite, D Pteronyssinus  5,000 AU/ml Negative    53. Cat Hair 10,000 BAU/ml 2+    54.  Dog Epithelia Negative    55. Mixed Feathers Negative    56. Horse Epithelia Negative    57. Cockroach, German Negative    58. Mouse Negative    59. Tobacco Leaf Negative             Intradermal - 12/09/21 0925     Time Antigen Placed 0930    Allergen Manufacturer Waynette Buttery    Location Arm    Number of Test 7    Control Negative    Mold 1 2+    Mold 2 2+    Mold 3 Negative    Mold 4 2+    Dog Negative    Cockroach Negative             Allergy testing results were read and interpreted by provider, documented by clinical staff.   Assessment and plan: Allergic rhinitis - Environmental allergy testing today is positive to grasses, trees, weeds, molds, dust mites, cat - Allergen avoidance discussed and handouts provided - Continue Singulair (montelukast) 1/2 tab daily for best control.  - Continue  Atrovent (ipratropium) 0.06% one spray per nostril 2-3 times daily as needed for runny nose/throat clearing. - Continue carbinoxamine 8mg  1-2 times a day as needed for allergy symptom control.   - Continue nasal saline rinses to remove allergens from the nasal cavities as well as  help with mucous clearance (this is especially helpful to do before the nasal sprays are given).   Use saline rinse prior to nasal spray use.  Use distilled water or boil water and bring to room temperature (do not use tap water).  Keep mouth open during entire process and breathe through mouth.   Asthma - Daily controller medication(s): Singulair as above - Rescue medications: Xopenex 2 puffs every 4-6 hours as needed for cough, wheeze, shortness of breath, chest tightness.   Let know if you tolerate this reliever inhaler  - Asthma control goals:  * Full participation in all desired activities (may need albuterol before activity) * Albuterol use two time or less a week on average (not counting use with activity) * Cough interfering with sleep two time or less a month * Oral steroids no more than once a year * No hospitalizations  Follow-up in 4 months or sooner if needed  I appreciate the opportunity to take part in Kellina's care. Please do not hesitate to contact me with questions.  Sincerely,   [place], MD Allergy/Immunology [facility]

## 2021-12-09 NOTE — Patient Instructions (Addendum)
Allergic rhinitis - Environmental allergy testing today is positive to grasses, trees, weeds, molds, dust mites, cat - Allergen avoidance discussed and handouts provided - Continue Singulair (montelukast) 1/2 tab daily for best control.  - Continue  Atrovent (ipratropium) 0.06% one spray per nostril 2-3 times daily as needed for runny nose/throat clearing. - Continue carbinoxamine 8mg  1-2 times a day as needed for allergy symptom control.   - Continue nasal saline rinses to remove allergens from the nasal cavities as well as help with mucous clearance (this is especially helpful to do before the nasal sprays are given).   Use saline rinse prior to nasal spray use.  Use distilled water or boil water and bring to room temperature (do not use tap water).  Keep mouth open during entire process and breathe through mouth.   Asthma - Daily controller medication(s): Singulair as above - Rescue medications: Xopenex 2 puffs every 4-6 hours as needed for cough, wheeze, shortness of breath, chest tightness.   Let us know if you tolerate this reliever inhaler  - Asthma control goals:  * Full participation in all desired activities (may need albuterol before activity) * Albuterol use two time or less a week on average (not counting use with activity) * Cough interfering with sleep two time or less a month * Oral steroids no more than once a year * No hospitalizations  Follow-up in 4 months or sooner if needed

## 2022-05-05 ENCOUNTER — Ambulatory Visit: Payer: Medicare Other | Admitting: Allergy

## 2022-06-02 ENCOUNTER — Ambulatory Visit: Payer: 59 | Admitting: Allergy

## 2022-11-07 ENCOUNTER — Other Ambulatory Visit: Payer: Self-pay | Admitting: Physician Assistant

## 2022-11-07 DIAGNOSIS — Z1231 Encounter for screening mammogram for malignant neoplasm of breast: Secondary | ICD-10-CM

## 2022-12-07 ENCOUNTER — Ambulatory Visit
Admission: RE | Admit: 2022-12-07 | Discharge: 2022-12-07 | Disposition: A | Discharge status: Home / Self Care | Payer: 59 | Source: Ambulatory Visit | Attending: Physician Assistant | Admitting: Physician Assistant

## 2022-12-07 DIAGNOSIS — Z1231 Encounter for screening mammogram for malignant neoplasm of breast: Secondary | ICD-10-CM

## 2023-11-14 ENCOUNTER — Other Ambulatory Visit: Payer: Self-pay | Admitting: Physician Assistant

## 2023-11-14 DIAGNOSIS — Z1231 Encounter for screening mammogram for malignant neoplasm of breast: Secondary | ICD-10-CM

## 2023-12-12 ENCOUNTER — Ambulatory Visit
Admission: RE | Admit: 2023-12-12 | Discharge: 2023-12-12 | Disposition: A | Discharge status: Home / Self Care | Source: Ambulatory Visit | Attending: Physician Assistant | Admitting: Physician Assistant

## 2023-12-12 DIAGNOSIS — Z1231 Encounter for screening mammogram for malignant neoplasm of breast: Secondary | ICD-10-CM
